# Patient Record
Sex: Female | Born: 1965 | Race: White | Hispanic: No | Marital: Married | Smoking: Former smoker
Health system: Southern US, Community
[De-identification: ages and names within clinical notes are randomized; demographics above are authoritative.]

## PROBLEM LIST (undated history)

## (undated) DIAGNOSIS — E785 Hyperlipidemia, unspecified: Secondary | ICD-10-CM

## (undated) DIAGNOSIS — I10 Essential (primary) hypertension: Secondary | ICD-10-CM

## (undated) DIAGNOSIS — F32A Depression, unspecified: Secondary | ICD-10-CM

## (undated) DIAGNOSIS — G4730 Sleep apnea, unspecified: Secondary | ICD-10-CM

## (undated) DIAGNOSIS — T7840XA Allergy, unspecified, initial encounter: Secondary | ICD-10-CM

## (undated) HISTORY — PX: BREAST EXCISIONAL BIOPSY: SUR124

## (undated) HISTORY — DX: Allergy, unspecified, initial encounter: T78.40XA

## (undated) HISTORY — DX: Hyperlipidemia, unspecified: E78.5

## (undated) HISTORY — PX: BREAST BIOPSY: SHX20

## (undated) HISTORY — DX: Depression, unspecified: F32.A

## (undated) HISTORY — DX: Essential (primary) hypertension: I10

## (undated) HISTORY — DX: Sleep apnea, unspecified: G47.30

---

## 1998-08-30 HISTORY — PX: VAGINAL HYSTERECTOMY: SUR661

## 2013-08-30 HISTORY — PX: BREAST BIOPSY: SHX20

## 2019-06-18 ENCOUNTER — Ambulatory Visit (INDEPENDENT_AMBULATORY_CARE_PROVIDER_SITE_OTHER): Payer: BLUE CROSS/BLUE SHIELD | Admitting: Pulmonary Disease

## 2019-06-18 ENCOUNTER — Other Ambulatory Visit: Payer: Self-pay

## 2019-06-18 VITALS — BP 120/76 | HR 62 | Temp 98.3°F | Ht 67.0 in | Wt 219.8 lb

## 2019-06-18 DIAGNOSIS — G4733 Obstructive sleep apnea (adult) (pediatric): Secondary | ICD-10-CM

## 2019-06-18 NOTE — Addendum Note (Signed)
Addended by: Nena Polio on: 06/18/2019 01:21 PM   Modules accepted: Orders

## 2019-06-18 NOTE — Progress Notes (Signed)
Subjective:     Patient ID: Becky House, female   DOB: 29-Jul-1966, 53 y.o.   MRN: [id number]  Patient with a history of obstructive sleep apnea  Relocated from [place] recently Has been on CPAP therapy  She was diagnosed with moderate obstructive sleep apnea with AHI of 16 Currently on auto titrating CPAP between 8 and 12  She does have some issues with bloating  Recent mask change has been beneficial  Usually goes to bed about 930 to 10 PM, takes about half an hour to 1 hour to fall asleep-she does use melatonin to help fall asleep sometimes  Wakes up about 1-2 times during the night  Final awakening time about 6 AM  She does get sleepy just after lunch  Weight is up about 5 to 10 pounds    Review of Systems  Constitutional: Negative.   HENT: Positive for sinus pressure.   Eyes: Negative.   Respiratory: Positive for apnea.   Cardiovascular: Positive for palpitations.  Gastrointestinal: Negative.   Endocrine: Negative.   Genitourinary: Negative.   Musculoskeletal: Negative.   Skin: Negative.   Allergic/Immunologic: Negative.   Neurological: Negative.   Hematological: Bruises/bleeds easily.  Psychiatric/Behavioral: Positive for decreased concentration and sleep disturbance.    Past medical history significant for depression, hyperlipidemia, allergies Von Willebrand disease and factor V Leiden  Reformed smoker only about 8-pack-year smoking history quit at age 13  Family history of coronary artery disease in mother and father  She has 2 children-no health problems    Objective:   Physical Exam Constitutional:      Appearance: Normal appearance.  HENT:     Head: Normocephalic and atraumatic.     Mouth/Throat:     Mouth: Mucous membranes are moist.  Eyes:     General:        Left eye: No discharge.     Pupils: Pupils are equal, round, and reactive to light.  Neck:     Musculoskeletal: Normal range of motion. No neck rigidity or muscular tenderness.   Cardiovascular:     Rate and Rhythm: Normal rate and regular rhythm.     Pulses: Normal pulses.     Heart sounds: Normal heart sounds. No murmur. No friction rub.  Pulmonary:     Effort: Pulmonary effort is normal. No respiratory distress.     Breath sounds: Normal breath sounds. No stridor. No wheezing or rhonchi.  Abdominal:     General: There is no distension.  Musculoskeletal: Normal range of motion.        General: No swelling.  Skin:    General: Skin is warm.     Coloration: Skin is not jaundiced or pale.  Neurological:     General: No focal deficit present.     Mental Status: She is alert.     Cranial Nerves: No cranial nerve deficit.  Psychiatric:        Mood and Affect: Mood normal.       Compliance data reveals 97% compliance, average hours of sleep 6 hours 42, auto titration 8-12 Residual AHI of 3 .  She does have some issues with bloating  Results of the Epworth flowsheet 06/18/2019  Sitting and reading 2  Watching TV 1  Sitting, inactive in a public place (e.g. a theatre or a meeting) 2  As a passenger in a car for an hour without a break 1  Lying down to rest in the afternoon when circumstances permit 2  Sitting and talking to  someone 0  Sitting quietly after a lunch without alcohol 2  In a car, while stopped for a few minutes in traffic 0  Total score 10    Assessment:     Moderate obstructive sleep apnea .  Compliant with CPAP therapy .  Compliance data does reveal excellent compliance with CPAP with adequate treatment .  Issues with bloating .  Adjust CPAP pressures from 8-12 to 5-15 .  Elevation of the end of the bed may be helpful .  If above is not helpful then we may have to consider BiPAP therapy  Importance of exercise and weight management also discussed .  Recommended graded exercises  Continue CPAP use  Sleep onset insomnia .  She will continue using melatonin .  Dose may be adjusted as needed  .  If she continues having sleep  maintenance issues .  Medications that may help sleep maintenance may also be considered    Plan:   .  I will see her back in the office in about 3 months  .  Encouraged to call with any significant concerns

## 2019-06-18 NOTE — Patient Instructions (Signed)
Obstructive sleep apnea  Good compliance  For bloating -We will adjust pressure to 5-15 from 8-12 -We will try to obtain a download from the machine again in about 3 to 4 weeks -Elevation of the head of her bed may help  If above does not seem to help, we may have to consider switching to BiPAP  Continue with melatonin, may increase dose for effect  We will see you back in the office in 3 months  Call with significant concerns

## 2019-07-31 DIAGNOSIS — R519 Headache, unspecified: Secondary | ICD-10-CM | POA: Diagnosis not present

## 2019-07-31 DIAGNOSIS — Z20828 Contact with and (suspected) exposure to other viral communicable diseases: Secondary | ICD-10-CM | POA: Diagnosis not present

## 2019-07-31 DIAGNOSIS — R5383 Other fatigue: Secondary | ICD-10-CM | POA: Diagnosis not present

## 2019-09-20 ENCOUNTER — Ambulatory Visit: Payer: BLUE CROSS/BLUE SHIELD | Admitting: Pulmonary Disease

## 2019-09-24 ENCOUNTER — Other Ambulatory Visit: Payer: Self-pay

## 2019-09-24 ENCOUNTER — Encounter: Payer: Self-pay | Admitting: Pulmonary Disease

## 2019-09-24 ENCOUNTER — Ambulatory Visit: Payer: BLUE CROSS/BLUE SHIELD | Admitting: Pulmonary Disease

## 2019-09-24 VITALS — BP 124/66 | HR 68 | Temp 97.0°F | Ht 69.0 in | Wt 220.4 lb

## 2019-09-24 DIAGNOSIS — Z9989 Dependence on other enabling machines and devices: Secondary | ICD-10-CM

## 2019-09-24 DIAGNOSIS — G4733 Obstructive sleep apnea (adult) (pediatric): Secondary | ICD-10-CM | POA: Diagnosis not present

## 2019-09-24 NOTE — Progress Notes (Signed)
Subjective:     Patient ID: Becky House, female   DOB: [birth year], 54 y.o.   MRN: [id number]  Patient with a history of obstructive sleep apnea  Continues on CPAP On auto titrating CPAP 8-12  Feels generally better Bloating issues better  She was diagnosed with moderate obstructive sleep apnea with AHI of 16 Currently on auto titrating CPAP between 8 and 12  Recent mask change has been beneficial  Usually goes to bed about 930 to 10 PM, takes about half an hour to 1 hour to fall asleep-she does use melatonin to help fall asleep sometimes  Wakes up about 1-2 times during the night  Final awakening time about 6 AM  She does get sleepy just after lunch  Weight is up about 5 to 10 pounds    Review of Systems  Constitutional: Negative.   HENT: Positive for sinus pressure.   Eyes: Negative.   Respiratory: Positive for apnea.   Cardiovascular: Positive for palpitations.  Gastrointestinal: Negative.   Endocrine: Negative.   Genitourinary: Negative.   Musculoskeletal: Negative.   Skin: Negative.   Allergic/Immunologic: Negative.   Neurological: Negative.   Psychiatric/Behavioral: Positive for decreased concentration and sleep disturbance.    Past medical history significant for depression, hyperlipidemia, allergies Von Willebrand disease and factor V Leiden  Reformed smoker only about 8-pack-year smoking history quit at age 31  Family history of coronary artery disease in mother and father  She has 2 children-no health problems    Objective:   Physical Exam Constitutional:      Appearance: Normal appearance.  HENT:     Head: Normocephalic and atraumatic.  Cardiovascular:     Rate and Rhythm: Normal rate and regular rhythm.     Pulses: Normal pulses.     Heart sounds: Normal heart sounds. No murmur. No friction rub.  Pulmonary:     Effort: Pulmonary effort is normal. No respiratory distress.     Breath sounds: Normal breath sounds. No stridor. No wheezing or  rhonchi.  Musculoskeletal:        General: Normal range of motion.     Cervical back: Normal range of motion. No rigidity. No muscular tenderness.  Neurological:     General: No focal deficit present.     Mental Status: She is alert.  Psychiatric:        Mood and Affect: Mood normal.       Compliance data reveals 97% compliance, average hours of sleep 6 hours 45, auto titration 8-12 Residual AHI of 2  Results of the Epworth flowsheet 06/18/2019  Sitting and reading 2  Watching TV 1  Sitting, inactive in a public place (e.g. a theatre or a meeting) 2  As a passenger in a car for an hour without a break 1  Lying down to rest in the afternoon when circumstances permit 2  Sitting and talking to someone 0  Sitting quietly after a lunch without alcohol 2  In a car, while stopped for a few minutes in traffic 0  Total score 10    Assessment:     Moderate obstructive sleep apnea .  Compliant with CPAP therapy .  Compliance data does reveal excellent compliance with CPAP with adequate treatment-residual AHI of 2 .  Issues with bloating .  CPAP currently set between 8-12  .  Recommended graded exercises  Continue CPAP use  Sleep onset insomnia .  She will continue using melatonin .  Dose may be adjusted as needed-this seems to help  .  If she continues having sleep maintenance issues .  Medications that may help sleep maintenance may also be considered-not desired at present    Plan:   .  I will see her back in the office in about 6 months  .  Encouraged to call with any significant concerns

## 2019-09-24 NOTE — Patient Instructions (Signed)
Obstructive sleep apnea  Your compliance data shows lites adequately treated No changes to CPAP at present  Increase activity as tolerated  We will see you back in 6 months  Call with any concerns

## 2019-12-15 DIAGNOSIS — G4733 Obstructive sleep apnea (adult) (pediatric): Secondary | ICD-10-CM | POA: Diagnosis not present

## 2020-01-14 DIAGNOSIS — G4733 Obstructive sleep apnea (adult) (pediatric): Secondary | ICD-10-CM | POA: Diagnosis not present

## 2020-02-08 DIAGNOSIS — H2513 Age-related nuclear cataract, bilateral: Secondary | ICD-10-CM | POA: Diagnosis not present

## 2020-02-08 DIAGNOSIS — H0015 Chalazion left lower eyelid: Secondary | ICD-10-CM | POA: Diagnosis not present

## 2020-02-11 ENCOUNTER — Other Ambulatory Visit: Payer: Self-pay

## 2020-02-11 ENCOUNTER — Ambulatory Visit: Payer: BC Managed Care – PPO | Admitting: Family

## 2020-02-11 ENCOUNTER — Encounter: Payer: Self-pay | Admitting: Family

## 2020-02-11 VITALS — BP 118/80 | HR 60 | Temp 97.7°F | Resp 16 | Ht 69.0 in | Wt 224.0 lb

## 2020-02-11 DIAGNOSIS — F339 Major depressive disorder, recurrent, unspecified: Secondary | ICD-10-CM | POA: Insufficient documentation

## 2020-02-11 DIAGNOSIS — D689 Coagulation defect, unspecified: Secondary | ICD-10-CM | POA: Diagnosis not present

## 2020-02-11 DIAGNOSIS — G4733 Obstructive sleep apnea (adult) (pediatric): Secondary | ICD-10-CM

## 2020-02-11 DIAGNOSIS — E782 Mixed hyperlipidemia: Secondary | ICD-10-CM | POA: Insufficient documentation

## 2020-02-11 DIAGNOSIS — Z8249 Family history of ischemic heart disease and other diseases of the circulatory system: Secondary | ICD-10-CM

## 2020-02-11 DIAGNOSIS — L989 Disorder of the skin and subcutaneous tissue, unspecified: Secondary | ICD-10-CM

## 2020-02-11 DIAGNOSIS — J301 Allergic rhinitis due to pollen: Secondary | ICD-10-CM

## 2020-02-11 DIAGNOSIS — F32A Depression, unspecified: Secondary | ICD-10-CM | POA: Insufficient documentation

## 2020-02-11 DIAGNOSIS — E785 Hyperlipidemia, unspecified: Secondary | ICD-10-CM | POA: Diagnosis not present

## 2020-02-11 DIAGNOSIS — F329 Major depressive disorder, single episode, unspecified: Secondary | ICD-10-CM

## 2020-02-11 DIAGNOSIS — E1169 Type 2 diabetes mellitus with other specified complication: Secondary | ICD-10-CM

## 2020-02-11 DIAGNOSIS — Z0000 Encounter for general adult medical examination without abnormal findings: Secondary | ICD-10-CM

## 2020-02-11 DIAGNOSIS — J309 Allergic rhinitis, unspecified: Secondary | ICD-10-CM

## 2020-02-11 HISTORY — DX: Allergic rhinitis due to pollen: J30.1

## 2020-02-11 HISTORY — DX: Coagulation defect, unspecified: D68.9

## 2020-02-11 HISTORY — DX: Obstructive sleep apnea (adult) (pediatric): G47.33

## 2020-02-11 HISTORY — DX: Family history of ischemic heart disease and other diseases of the circulatory system: Z82.49

## 2020-02-11 HISTORY — DX: Allergic rhinitis, unspecified: J30.9

## 2020-02-11 HISTORY — DX: Major depressive disorder, recurrent, unspecified: F33.9

## 2020-02-11 HISTORY — DX: Mixed hyperlipidemia: E78.2

## 2020-02-11 LAB — COMPREHENSIVE METABOLIC PANEL
ALT: 19 U/L (ref 0–35)
AST: 16 U/L (ref 0–37)
Albumin: 4.4 g/dL (ref 3.5–5.2)
Alkaline Phosphatase: 83 U/L (ref 39–117)
BUN: 21 mg/dL (ref 6–23)
CO2: 31 mEq/L (ref 19–32)
Calcium: 9.5 mg/dL (ref 8.4–10.5)
Chloride: 103 mEq/L (ref 96–112)
Creatinine, Ser: 0.77 mg/dL (ref 0.40–1.20)
GFR: 77.44 mL/min (ref >60.00)
Glucose, Bld: 90 mg/dL (ref 70–99)
Potassium: 4.4 mEq/L (ref 3.5–5.1)
Sodium: 141 mEq/L (ref 135–145)
Total Bilirubin: 0.4 mg/dL (ref 0.2–1.2)
Total Protein: 6.4 g/dL (ref 6.0–8.3)

## 2020-02-11 LAB — LIPID PANEL
Cholesterol: 223 mg/dL — ABNORMAL HIGH (ref 0–200)
HDL: 57 mg/dL (ref >39.00)
LDL Cholesterol: 129 mg/dL — ABNORMAL HIGH (ref 0–99)
NonHDL: 166.13
Total CHOL/HDL Ratio: 4
Triglycerides: 186 mg/dL — ABNORMAL HIGH (ref 0.0–149.0)
VLDL: 37.2 mg/dL (ref 0.0–40.0)

## 2020-02-11 NOTE — Patient Instructions (Addendum)
Please complete lab work prior to leaving.  Welcome to [place]! 

## 2020-02-11 NOTE — Progress Notes (Signed)
Subjective:    Patient ID: Becky House, female    DOB: September 20, 1965, 54 y.o.   MRN: [id number]  HPI   Patient is a 54 yr old female who presents today to establish care. Moved from [place].  Skin lesions- would like evaluated.  Notes a flaking lesion on her right cheek and some lesions on her abdomen.    Family hx of CAD- both of her parents had early onset CAD. She was followed with a   [facility]- she is maintained on CPAP and followed by sleep medicine (Dr. Ander Slade).  HTN- not on antihypertensive. Was on bp meds in the past.  BP Readings from Last 3 Encounters:  02/11/20 118/80  09/24/19 124/66  06/18/19 120/76   Hyperlipidemia- maintained on atorvastatin. Ran out of lipitor 2 months ago. Taking red yeast rice.   Depression- maintained on zoloft. Not sleeping well. Notes increased stress levels at work. If she wakes up her mind does not shut off.  She has been on zoloft since 1998.  Has to push herself out of bed.    Allergies- on claritin and azelastine.  Reports that this regimen works well for her.    Reports hx of heart palpitations- has seen cardiology. Reports that they did not find any concerning rhythms.    Reports that she is a heterozygote for factor V leiden gene. Reports that when she was 33 she tested positive for von willebrands.     Review of Systems See HPI  Past Medical History:  Diagnosis Date  . Allergy   . Depression   . Hyperlipidemia   . Hypertension   . Sleep apnea      Social History   Socioeconomic History  . Marital status: Married    Spouse name: Not on file  . Number of children: Not on file  . Years of education: Not on file  . Highest education level: Not on file  Occupational History  . Not on file  Tobacco Use  . Smoking status: Former Smoker    Types: Cigarettes  . Smokeless tobacco: Never Used  . Tobacco comment: quit 1988  Substance and Sexual Activity  . Alcohol use: Yes  . Drug use: Never  . Sexual activity: Not on file    Other Topics Concern  . Not on file  Social History Narrative  . Not on file   Social Determinants of Health   Financial Resource Strain:   . Difficulty of Paying Living Expenses:   Food Insecurity:   . Worried About Charity fundraiser in the Last Year:   . Arboriculturist in the Last Year:   Transportation Needs:   . Film/video editor (Medical):   Marland Kitchen Lack of Transportation (Non-Medical):   Physical Activity:   . Days of Exercise per Week:   . Minutes of Exercise per Session:   Stress:   . Feeling of Stress :   Social Connections:   . Frequency of Communication with Friends and Family:   . Frequency of Social Gatherings with Friends and Family:   . Attends Religious Services:   . Active Member of Clubs or Organizations:   . Attends Archivist Meetings:   Marland Kitchen Marital Status:   Intimate Partner Violence:   . Fear of Current or Ex-Partner:   . Emotionally Abused:   Marland Kitchen Physically Abused:   . Sexually Abused:     Past Surgical History:  Procedure Laterality Date  . BREAST BIOPSY  2015  .  VAGINAL HYSTERECTOMY  2000    No family history on file.  No Known Allergies  Current Outpatient Medications on File Prior to Visit  Medication Sig Dispense Refill  . Ascorbic Acid (VITAMIN C) 1000 MG tablet Take 1,000 mg by mouth daily.    Marland Kitchen azelastine (ASTELIN) 0.1 % nasal spray     . Cholecalciferol (VITAMIN D3 PO) Take 1,000 Units by mouth.    . loratadine (CLARITIN) 10 MG tablet Take 10 mg by mouth daily.    . Multiple Vitamins-Minerals (WOMENS MULTI PO) Take by mouth.    . sertraline (ZOLOFT) 25 MG tablet Take 25 mg by mouth daily.    Marland Kitchen atorvastatin (LIPITOR) 10 MG tablet Take 10 mg by mouth daily. (Patient not taking: Reported on 02/11/2020)     No current facility-administered medications on file prior to visit.    BP 118/80 (BP Location: Left Arm, Patient Position: Sitting, Cuff Size: Normal)   Pulse 60   Temp 97.7 F (36.5 C) (Temporal)   Resp 16   Ht 5'  9" (1.753 m)   Wt 224 lb (101.6 kg)   SpO2 99%   BMI 33.08 kg/m       Objective:   Physical Exam Constitutional:      Appearance: She is well-developed.  Neck:     Thyroid: No thyromegaly.  Cardiovascular:     Rate and Rhythm: Normal rate and regular rhythm.     Heart sounds: Normal heart sounds. No murmur heard.   Pulmonary:     Effort: Pulmonary effort is normal. No respiratory distress.     Breath sounds: Normal breath sounds. No wheezing.  Musculoskeletal:     Cervical back: Neck supple.  Skin:    General: Skin is warm and dry.     Comments: Dry flaking lesion noted on right cheek  Dry lesion on left lateral abdomen and similar lesion on right lateral abdomen which appear most consistent with keratoses  Neurological:     Mental Status: She is alert and oriented to person, place, and time.  Psychiatric:        Behavior: Behavior normal.        Thought Content: Thought content normal.        Judgment: Judgment normal.           Assessment & Plan:  (clotting disorder) Heterozygous for factor V Leiden/history of von Willebrand's disease-patient's history is unclear.  She has had a remote work-up.  I will refer to hematology for further evaluation.  Family history of early coronary artery disease-will refer to cardiology for surveillance.  Hyperlipidemia-not currently on statin.  Ultimately hyperlipidemia.  Will likely restart statin.  Skin lesions-will refer to dermatology for consult.  I am concerned about the lesion on her right cheek.  Hypertension-blood pressure currently stable off of medication.  Depression-it seems that she still is having some depressive symptoms.  However she does not wish to increase her medication at this time.  We will continue Zoloft 25 mg once daily.  The patient is advised to let me know if her symptoms worsen or if she changes her mind about medication adjustment.  Allergic rhinitis-currently stable on Astelin and  Claritin.  Obstructive sleep apnea-clinically stable on CPAP.  Management per pulmonology.   This visit occurred during the SARS-CoV-2 public health emergency.  Safety protocols were in place, including screening questions prior to the visit, additional usage of staff PPE, and extensive cleaning of exam room while observing appropriate contact time as  indicated for disinfecting solutions.

## 2020-02-12 ENCOUNTER — Telehealth: Payer: Self-pay | Admitting: Family

## 2020-02-12 NOTE — Telephone Encounter (Signed)
lmom to inform patient of referral appointment 7/18 at 815 am

## 2020-02-14 DIAGNOSIS — G4733 Obstructive sleep apnea (adult) (pediatric): Secondary | ICD-10-CM | POA: Diagnosis not present

## 2020-02-19 ENCOUNTER — Encounter (HOSPITAL_BASED_OUTPATIENT_CLINIC_OR_DEPARTMENT_OTHER): Payer: Self-pay

## 2020-02-19 ENCOUNTER — Ambulatory Visit (HOSPITAL_BASED_OUTPATIENT_CLINIC_OR_DEPARTMENT_OTHER)
Admission: RE | Admit: 2020-02-19 | Discharge: 2020-02-19 | Disposition: A | Discharge status: Home / Self Care | Payer: BC Managed Care – PPO | Source: Ambulatory Visit | Attending: Family | Admitting: Family

## 2020-02-19 ENCOUNTER — Other Ambulatory Visit: Payer: Self-pay

## 2020-02-19 DIAGNOSIS — Z1231 Encounter for screening mammogram for malignant neoplasm of breast: Secondary | ICD-10-CM | POA: Insufficient documentation

## 2020-02-19 DIAGNOSIS — Z0000 Encounter for general adult medical examination without abnormal findings: Secondary | ICD-10-CM

## 2020-02-25 ENCOUNTER — Encounter: Payer: Self-pay | Admitting: Cardiology

## 2020-02-25 ENCOUNTER — Other Ambulatory Visit: Payer: Self-pay

## 2020-02-25 ENCOUNTER — Ambulatory Visit: Payer: BC Managed Care – PPO | Admitting: Cardiology

## 2020-02-25 VITALS — BP 112/90 | HR 63 | Ht 69.0 in | Wt 225.0 lb

## 2020-02-25 DIAGNOSIS — G4733 Obstructive sleep apnea (adult) (pediatric): Secondary | ICD-10-CM | POA: Diagnosis not present

## 2020-02-25 DIAGNOSIS — E782 Mixed hyperlipidemia: Secondary | ICD-10-CM | POA: Diagnosis not present

## 2020-02-25 DIAGNOSIS — R030 Elevated blood-pressure reading, without diagnosis of hypertension: Secondary | ICD-10-CM

## 2020-02-25 DIAGNOSIS — Z8249 Family history of ischemic heart disease and other diseases of the circulatory system: Secondary | ICD-10-CM | POA: Diagnosis not present

## 2020-02-25 DIAGNOSIS — Z79899 Other long term (current) drug therapy: Secondary | ICD-10-CM

## 2020-02-25 DIAGNOSIS — R011 Cardiac murmur, unspecified: Secondary | ICD-10-CM

## 2020-02-25 HISTORY — DX: Cardiac murmur, unspecified: R01.1

## 2020-02-25 HISTORY — DX: Elevated blood-pressure reading, without diagnosis of hypertension: R03.0

## 2020-02-25 NOTE — Patient Instructions (Signed)
Medication Instructions:  No medication changes. *If you need a refill on your cardiac medications before your next appointment, please call your pharmacy*   Lab Work: Your physician recommends that you return for lab work in: before your next visit. You need to have labs done when you are fasting.  You can come Monday through Friday 8:30 am to 12:00 pm and 1:15 to 4:30. You do not need to make an appointment as the order has already been placed. The labs you are going to have done are LFT and Lipids.   If you have labs (blood work) drawn today and your tests are completely normal, you will receive your results only by: Marland Kitchen MyChart Message (if you have MyChart) OR . A paper copy in the mail If you have any lab test that is abnormal or we need to change your treatment, we will call you to review the results.   Testing/Procedures: Your physician has requested that you have an echocardiogram. Echocardiography is a painless test that uses sound waves to create images of your heart. It provides your doctor with information about the size and shape of your heart and how well your heart's chambers and valves are working. This procedure takes approximately one hour. There are no restrictions for this procedure.  We will order CT coronary calcium score $150  Please call [phone] to schedule   [facility]  [address], [place] [address]  Follow-Up: At [facility], you and your health needs are our priority.  As part of our continuing mission to provide you with exceptional heart care, we have created designated Provider Care Teams.  These Care Teams include your primary Cardiologist (physician) and Advanced Practice Providers (APPs -  Physician Assistants and Nurse Practitioners) who all work together to provide you with the care you need, when you need it.  We recommend signing up for the patient portal called "MyChart".  Sign up information is provided on this After  Visit Summary.  MyChart is used to connect with patients for Virtual Visits (Telemedicine).  Patients are able to view lab/test results, encounter notes, upcoming appointments, etc.  Non-urgent messages can be sent to your provider as well.   To learn more about what you can do with MyChart, go to [email or web address].    Your next appointment:   3 month(s)  The format for your next appointment:   In Person  Provider:   Jyl Heinz, MD   Other Instructions  Coronary Calcium Scan A coronary calcium scan is an imaging test used to look for deposits of plaque in the inner lining of the blood vessels of the heart (coronary arteries). Plaque is made up of calcium, protein, and fatty substances. These deposits of plaque can partly clog and narrow the coronary arteries without producing any symptoms or warning signs. This puts a person at risk for a heart attack. This test is recommended for people who are at moderate risk for heart disease. The test can find plaque deposits before symptoms develop. Tell a health care provider about:  Any allergies you have.  All medicines you are taking, including vitamins, herbs, eye drops, creams, and over-the-counter medicines.  Any problems you or family members have had with anesthetic medicines.  Any blood disorders you have.  Any surgeries you have had.  Any medical conditions you have.  Whether you are pregnant or may be pregnant. What are the risks? Generally, this is a safe procedure. However, problems may  occur, including:  Harm to a pregnant woman and her unborn baby. This test involves the use of radiation. Radiation exposure can be dangerous to a pregnant woman and her unborn baby. If you are pregnant or think you may be pregnant, you should not have this procedure done.  Slight increase in the risk of cancer. This is because of the radiation involved in the test. What happens before the procedure? Ask your health care provider  for any specific instructions on how to prepare for this procedure. You may be asked to avoid products that contain caffeine, tobacco, or nicotine for 4 hours before the procedure. What happens during the procedure?   You will undress and remove any jewelry from your neck or chest.  You will put on a hospital gown.  Sticky electrodes will be placed on your chest. The electrodes will be connected to an electrocardiogram (ECG) machine to record a tracing of the electrical activity of your heart.  You will lie down on a curved bed that is attached to the [place].  You may be given medicine to slow down your heart rate so that clear pictures can be created.  You will be moved into the CT scanner, and the CT scanner will take pictures of your heart. During this time, you will be asked to lie still and hold your breath for 2-3 seconds at a time while each picture of your heart is being taken. The procedure may vary among health care providers and hospitals. What happens after the procedure?  You can get dressed.  You can return to your normal activities.  It is up to you to get the results of your procedure. Ask your health care provider, or the department that is doing the procedure, when your results will be ready. Summary  A coronary calcium scan is an imaging test used to look for deposits of plaque in the inner lining of the blood vessels of the heart (coronary arteries). Plaque is made up of calcium, protein, and fatty substances.  Generally, this is a safe procedure. Tell your health care provider if you are pregnant or may be pregnant.  Ask your health care provider for any specific instructions on how to prepare for this procedure.  A CT scanner will take pictures of your heart.  You can return to your normal activities after the scan is done. This information is not intended to replace advice given to you by your health care provider. Make sure you discuss any questions you  have with your health care provider. Document Revised: 03/06/2019 Document Reviewed: 03/06/2019 Elsevier Patient Education  [place].  Echocardiogram An echocardiogram is a procedure that uses painless sound waves (ultrasound) to produce an image of the heart. Images from an echocardiogram can provide important information about:  Signs of coronary artery disease (CAD).  Aneurysm detection. An aneurysm is a weak or damaged part of an artery wall that bulges out from the normal force of blood pumping through the body.  Heart size and shape. Changes in the size or shape of the heart can be associated with certain conditions, including heart failure, aneurysm, and CAD.  Heart muscle function.  Heart valve function.  Signs of a past heart attack.  Fluid buildup around the heart.  Thickening of the heart muscle.  A tumor or infectious growth around the heart valves. Tell a health care provider about:  Any allergies you have.  All medicines you are taking, including vitamins, herbs, eye drops,  creams, and over-the-counter medicines.  Any blood disorders you have.  Any surgeries you have had.  Any medical conditions you have.  Whether you are pregnant or may be pregnant. What are the risks? Generally, this is a safe procedure. However, problems may occur, including:  Allergic reaction to dye (contrast) that may be used during the procedure. What happens before the procedure? No specific preparation is needed. You may eat and drink normally. What happens during the procedure?   An IV tube may be inserted into one of your veins.  You may receive contrast through this tube. A contrast is an injection that improves the quality of the pictures from your heart.  A gel will be applied to your chest.  A wand-like tool (transducer) will be moved over your chest. The gel will help to transmit the sound waves from the transducer.  The sound waves will harmlessly bounce  off of your heart to allow the heart images to be captured in real-time motion. The images will be recorded on a computer. The procedure may vary among health care providers and hospitals. What happens after the procedure?  You may return to your normal, everyday life, including diet, activities, and medicines, unless your health care provider tells you not to do that. Summary  An echocardiogram is a procedure that uses painless sound waves (ultrasound) to produce an image of the heart.  Images from an echocardiogram can provide important information about the size and shape of your heart, heart muscle function, heart valve function, and fluid buildup around your heart.  You do not need to do anything to prepare before this procedure. You may eat and drink normally.  After the echocardiogram is completed, you may return to your normal, everyday life, unless your health care provider tells you not to do that. This information is not intended to replace advice given to you by your health care provider. Make sure you discuss any questions you have with your health care provider. Document Revised: 12/07/2018 Document Reviewed: 09/18/2016 Elsevier Patient Education  [place].

## 2020-02-25 NOTE — Progress Notes (Signed)
Cardiology Office Note:    Date:  02/25/2020   ID:  Becky House, DOB 07-Dec-1965, MRN [id number]  PCP:  Becky Alar, NP  Cardiologist:  Becky Lindau, MD   Referring MD: Becky Alar, NP    ASSESSMENT:    1. OSA (obstructive sleep apnea)   2. Cardiac murmur   3. Mixed dyslipidemia   4. Family history of early CAD   76. Blood pressure elevated without history of HTN    PLAN:    In order of problems listed above:  1. Primary prevention stressed with the patient.  Importance of compliance with diet medication stressed and she vocalized understanding.  Weight reduction was stressed.  I told her to exercise at least half an hour a day 5 days a week and she promises to do so. 2. Mixed dyslipidemia: Lipids were reviewed extensively.  Low-carb diet and diet low in fatty foods was emphasized and she promises to do better.  We will recheck her blood work in 3 months when she comes for follow-up appointment. 3. Cardiac murmur: Echocardiogram will be done to assess murmur heard on auscultation 4. Strong family history of coronary artery disease: We will monitor lipids and also get records CT score of coronary arteries to assess risks.  This was explained to her.  Weight reduction was stressed and she promises to do better. 5. Obstructive sleep apnea: Sleep health issues were discussed 6. Patient will be seen in follow-up appointment in 3 months or earlier if the patient has any concerns    Medication Adjustments/Labs and Tests Ordered: Current medicines are reviewed at length with the patient today.  Concerns regarding medicines are outlined above.  No orders of the defined types were placed in this encounter.  No orders of the defined types were placed in this encounter.    History of Present Illness:    Becky House is a 54 y.o. female who is being seen today for the evaluation of family history of coronary artery disease and mixed dyslipidemia at the request of  Becky Alar, NP.  Patient is a pleasant 54 year old female.  She has past medical history of mixed dyslipidemia.  She leads a sedentary lifestyle.  She denies any chest pain orthopnea or PND.  She did bicycling for about 16 miles on the weekend without any problems.  No chest pain orthopnea or PND.  She has strong family history of coronary artery disease.  At the time of my evaluation, the patient is alert awake oriented and in no distress.  Past Medical History:  Diagnosis Date  . Allergy   . Depression   . Hyperlipidemia   . Hypertension   . Sleep apnea     Past Surgical History:  Procedure Laterality Date  . BREAST BIOPSY  2015  . BREAST EXCISIONAL BIOPSY    . VAGINAL HYSTERECTOMY  2000    Current Medications: Current Meds  Medication Sig  . Ascorbic Acid (VITAMIN C) 1000 MG tablet Take 1,000 mg by mouth daily.  Marland Kitchen azelastine (ASTELIN) 0.1 % nasal spray   . Cholecalciferol (VITAMIN D3 PO) Take 1,000 Units by mouth.  . loratadine (CLARITIN) 10 MG tablet Take 10 mg by mouth daily.  . Multiple Vitamins-Minerals (WOMENS MULTI PO) Take by mouth.  . Red Yeast Rice 500 MG/0.5GM POWD   . sertraline (ZOLOFT) 25 MG tablet Take 25 mg by mouth daily.     Allergies:   Patient has no known allergies.   Social History   Socioeconomic History  .  Marital status: Married    Spouse name: Not on file  . Number of children: Not on file  . Years of education: Not on file  . Highest education level: Not on file  Occupational History  . Not on file  Tobacco Use  . Smoking status: Former Smoker    Types: Cigarettes  . Smokeless tobacco: Never Used  . Tobacco comment: quit 1988  Substance and Sexual Activity  . Alcohol use: Yes    Comment: on weekends/socially  . Drug use: Never  . Sexual activity: Not on file  Other Topics Concern  . Not on file  Social History Narrative   Works for [employer], in Chief Executive Officer   2 grown Daughters- in [place]   Has 2 grandchildren   Enjoys  e-biking.     Walking   Has a dog   Completed some college    Social Determinants of Health   Financial Resource Strain:   . Difficulty of Paying Living Expenses:   Food Insecurity:   . Worried About Charity fundraiser in the Last Year:   . Arboriculturist in the Last Year:   Transportation Needs:   . Film/video editor (Medical):   Marland Kitchen Lack of Transportation (Non-Medical):   Physical Activity:   . Days of Exercise per Week:   . Minutes of Exercise per Session:   Stress:   . Feeling of Stress :   Social Connections:   . Frequency of Communication with Friends and Family:   . Frequency of Social Gatherings with Friends and Family:   . Attends Religious Services:   . Active Member of Clubs or Organizations:   . Attends Archivist Meetings:   Marland Kitchen Marital Status:      Family History: The patient's family history includes CAD (age of onset: 62) in her mother; CAD (age of onset: 65) in her father.  ROS:   Please see the history of present illness.    All other systems reviewed and are negative.  EKGs/Labs/Other Studies Reviewed:    The following studies were reviewed today: EKG reveals sinus rhythm and nonspecific ST-T changes   Recent Labs: 02/11/2020: ALT 19; BUN 21; Creatinine, Ser 0.77; Potassium 4.4; Sodium 141  Recent Lipid Panel    Component Value Date/Time   CHOL 223 (H) 02/11/2020 0854   TRIG 186.0 (H) 02/11/2020 0854   HDL 57.00 02/11/2020 0854   CHOLHDL 4 02/11/2020 0854   VLDL 37.2 02/11/2020 0854   LDLCALC 129 (H) 02/11/2020 0854    Physical Exam:    VS:  BP 112/90   Pulse 63   Ht 5\' 9"  (1.753 m)   Wt 225 lb (102.1 kg)   SpO2 96%   BMI 33.23 kg/m     Wt Readings from Last 3 Encounters:  02/25/20 225 lb (102.1 kg)  02/11/20 224 lb (101.6 kg)  09/24/19 220 lb 6.4 oz (100 kg)     GEN: Patient is in no acute distress HEENT: Normal NECK: No JVD; No carotid bruits LYMPHATICS: No lymphadenopathy CARDIAC: S1 S2 regular, 2/6 systolic  murmur at the apex. RESPIRATORY:  Clear to auscultation without rales, wheezing or rhonchi  ABDOMEN: Soft, non-tender, non-distended MUSCULOSKELETAL:  No edema; No deformity  SKIN: Warm and dry NEUROLOGIC:  Alert and oriented x 3 PSYCHIATRIC:  Normal affect    Signed, Becky Lindau, MD  02/25/2020 8:38 AM    [place]

## 2020-02-26 MED ORDER — ROSUVASTATIN CALCIUM 10 MG PO TABS
10.0000 mg | ORAL_TABLET | Freq: Every day | ORAL | 3 refills | Status: DC
Start: 2020-02-26 — End: 2020-12-15

## 2020-03-03 ENCOUNTER — Encounter: Payer: Self-pay | Admitting: Family

## 2020-03-04 MED ORDER — SERTRALINE HCL 25 MG PO TABS: 25.0000 mg | ORAL_TABLET | Freq: Every day | ORAL | 1 refills | Status: DC

## 2020-03-05 ENCOUNTER — Ambulatory Visit (HOSPITAL_BASED_OUTPATIENT_CLINIC_OR_DEPARTMENT_OTHER): Payer: BC Managed Care – PPO

## 2020-03-07 DIAGNOSIS — H0015 Chalazion left lower eyelid: Secondary | ICD-10-CM | POA: Diagnosis not present

## 2020-03-14 ENCOUNTER — Other Ambulatory Visit: Payer: Self-pay

## 2020-03-14 ENCOUNTER — Encounter: Payer: Self-pay | Admitting: Physician Assistant

## 2020-03-14 ENCOUNTER — Ambulatory Visit: Payer: BC Managed Care – PPO | Admitting: Physician Assistant

## 2020-03-14 ENCOUNTER — Other Ambulatory Visit: Payer: Self-pay | Admitting: Family

## 2020-03-14 DIAGNOSIS — D1800 Hemangioma unspecified site: Secondary | ICD-10-CM

## 2020-03-14 DIAGNOSIS — D229 Melanocytic nevi, unspecified: Secondary | ICD-10-CM

## 2020-03-14 DIAGNOSIS — D485 Neoplasm of uncertain behavior of skin: Secondary | ICD-10-CM

## 2020-03-14 DIAGNOSIS — D2262 Melanocytic nevi of left upper limb, including shoulder: Secondary | ICD-10-CM

## 2020-03-14 DIAGNOSIS — D0439 Carcinoma in situ of skin of other parts of face: Secondary | ICD-10-CM | POA: Diagnosis not present

## 2020-03-14 DIAGNOSIS — L738 Other specified follicular disorders: Secondary | ICD-10-CM

## 2020-03-14 DIAGNOSIS — B360 Pityriasis versicolor: Secondary | ICD-10-CM | POA: Diagnosis not present

## 2020-03-14 DIAGNOSIS — L578 Other skin changes due to chronic exposure to nonionizing radiation: Secondary | ICD-10-CM

## 2020-03-14 DIAGNOSIS — Z1283 Encounter for screening for malignant neoplasm of skin: Secondary | ICD-10-CM

## 2020-03-14 DIAGNOSIS — L814 Other melanin hyperpigmentation: Secondary | ICD-10-CM | POA: Diagnosis not present

## 2020-03-14 DIAGNOSIS — D6800 Von Willebrand disease, unspecified: Secondary | ICD-10-CM

## 2020-03-14 DIAGNOSIS — D689 Coagulation defect, unspecified: Secondary | ICD-10-CM

## 2020-03-14 DIAGNOSIS — L821 Other seborrheic keratosis: Secondary | ICD-10-CM

## 2020-03-14 DIAGNOSIS — D0430 Carcinoma in situ of skin of unspecified part of face: Secondary | ICD-10-CM

## 2020-03-14 NOTE — Progress Notes (Addendum)
New Patient   Subjective  Becky House is a 54 y.o. female who presents for the following: Skin Problem (right cheek x 1 year- stays scaly, spots on abdomen - ?sk).  The following portions of the chart were reviewed this encounter and updated as appropriate: Tobacco  Allergies  Meds  Problems  Med Hx  Surg Hx  Fam Hx      Objective  Well appearing patient in no apparent distress; mood and affect are within normal limits.  A full examination was performed including scalp, head, eyes, ears, nose, lips, neck, chest, axillae, abdomen, back, buttocks, bilateral upper extremities, bilateral lower extremities, hands, feet, fingers, toes, fingernails, and toenails. All findings within normal limits unless otherwise noted below.  Objective  Head - Anterior (Face): Small yellow papules with a central dell.   Objective  Abdomen: Red papules.  Objective  Left Nasal Sidewall: Bichromic dark nested macule. .      Objective  head to toe: No atypical nevi   Objective  Right Buccal Cheek : Hyperkeratotic scale with pink base      Assessment & Plan  Sebaceous hyperplasia Head - Anterior (Face)  Hemangioma, unspecified site Abdomen  Nevus spilus Left Forearm - Posterior  Neoplasm of uncertain behavior of skin Left Nasal Sidewall  Epidermal / dermal shaving  Lesion diameter (cm):  1 Informed consent: discussed and consent obtained   Timeout: patient name, date of birth, surgical site, and procedure verified   Procedure prep:  Patient was prepped and draped in usual sterile fashion Prep type:  Chlorhexidine Anesthesia: the lesion was anesthetized in a standard fashion   Anesthetic:  1% lidocaine w/ epinephrine 1-100,000 local infiltration Instrument used: DermaBlade   Hemostasis achieved with: aluminum chloride   Outcome: patient tolerated procedure well   Post-procedure details: sterile dressing applied and wound care instructions given   Dressing type:  petrolatum gauze, petrolatum and bandage    Specimen 2 - Surgical pathology Differential Diagnosis: Lentigo R/O dysplasia Check Margins: No  Screening exam for skin cancer head to toe  Carcinoma in situ of skin of face, unspecified location Right Buccal Cheek   Epidermal / dermal shaving  Lesion diameter (cm):  1.2 Informed consent: discussed and consent obtained   Timeout: patient name, date of birth, surgical site, and procedure verified   Procedure prep:  Patient was prepped and draped in usual sterile fashion Prep type:  Chlorhexidine Anesthesia: the lesion was anesthetized in a standard fashion   Anesthetic:  1% lidocaine w/ epinephrine 1-100,000 local infiltration Instrument used: DermaBlade and flexible razor blade   Hemostasis achieved with: pressure, aluminum chloride and electrodesiccation   Outcome: patient tolerated procedure well   Post-procedure details: sterile dressing applied and wound care instructions given   Post-procedure details comment:  Ointment and small bandage applied Dressing type: petrolatum gauze, petrolatum and bandage    Specimen 1 - Surgical pathology Differential Diagnosis: R/O SCC Check Margins: No Lentigines - Scattered tan macules - Discussed due to sun exposure - Benign, observe - Call for any changes  Seborrheic Keratoses - Stuck-on, waxy, tan-brown papules and plaques  - Discussed benign etiology and prognosis. - Observe - Call for any changes  Melanocytic Nevi - Tan-brown and/or pink-flesh-colored symmetric macules and papules - Benign appearing on exam today - Observation - Call clinic for new or changing moles - Recommend daily use of broad spectrum spf 30+ sunscreen to sun-exposed areas.   Hemangiomas - Red papules - Discussed benign nature - Observe -  Call for any changes  Actinic Damage - diffuse scaly erythematous macules with underlying dyspigmentation - Recommend daily broad spectrum sunscreen SPF 30+ to  sun-exposed areas, reapply every 2 hours as needed.  - Call for new or changing lesions.  Skin cancer screening performed today.

## 2020-03-15 DIAGNOSIS — G4733 Obstructive sleep apnea (adult) (pediatric): Secondary | ICD-10-CM | POA: Diagnosis not present

## 2020-03-17 ENCOUNTER — Telehealth: Payer: Self-pay | Admitting: Family

## 2020-03-17 ENCOUNTER — Inpatient Hospital Stay: Payer: BC Managed Care – PPO | Attending: Family | Admitting: Family

## 2020-03-17 ENCOUNTER — Ambulatory Visit (INDEPENDENT_AMBULATORY_CARE_PROVIDER_SITE_OTHER): Payer: BC Managed Care – PPO | Admitting: Family

## 2020-03-17 ENCOUNTER — Other Ambulatory Visit: Payer: Self-pay

## 2020-03-17 ENCOUNTER — Encounter: Payer: Self-pay | Admitting: Family

## 2020-03-17 ENCOUNTER — Inpatient Hospital Stay: Payer: BC Managed Care – PPO

## 2020-03-17 VITALS — BP 126/80 | HR 57 | Temp 96.8°F | Resp 16 | Ht 69.0 in | Wt 225.0 lb

## 2020-03-17 VITALS — BP 129/74 | HR 65 | Temp 96.8°F | Resp 16 | Ht 69.0 in | Wt 225.1 lb

## 2020-03-17 DIAGNOSIS — D689 Coagulation defect, unspecified: Secondary | ICD-10-CM

## 2020-03-17 DIAGNOSIS — Z0000 Encounter for general adult medical examination without abnormal findings: Secondary | ICD-10-CM

## 2020-03-17 DIAGNOSIS — Z23 Encounter for immunization: Secondary | ICD-10-CM

## 2020-03-17 DIAGNOSIS — F17210 Nicotine dependence, cigarettes, uncomplicated: Secondary | ICD-10-CM | POA: Insufficient documentation

## 2020-03-17 DIAGNOSIS — D6800 Von Willebrand disease, unspecified: Secondary | ICD-10-CM

## 2020-03-17 DIAGNOSIS — R002 Palpitations: Secondary | ICD-10-CM | POA: Insufficient documentation

## 2020-03-17 DIAGNOSIS — I10 Essential (primary) hypertension: Secondary | ICD-10-CM | POA: Diagnosis not present

## 2020-03-17 DIAGNOSIS — G4730 Sleep apnea, unspecified: Secondary | ICD-10-CM | POA: Diagnosis not present

## 2020-03-17 DIAGNOSIS — D6851 Activated protein C resistance: Secondary | ICD-10-CM | POA: Diagnosis not present

## 2020-03-17 DIAGNOSIS — E785 Hyperlipidemia, unspecified: Secondary | ICD-10-CM | POA: Diagnosis not present

## 2020-03-17 DIAGNOSIS — Z79899 Other long term (current) drug therapy: Secondary | ICD-10-CM | POA: Diagnosis not present

## 2020-03-17 DIAGNOSIS — F329 Major depressive disorder, single episode, unspecified: Secondary | ICD-10-CM | POA: Diagnosis not present

## 2020-03-17 DIAGNOSIS — Z90721 Acquired absence of ovaries, unilateral: Secondary | ICD-10-CM | POA: Insufficient documentation

## 2020-03-17 LAB — CBC WITH DIFFERENTIAL (CANCER CENTER ONLY)
Abs Immature Granulocytes: 0.02 10*3/uL (ref 0.00–0.07)
Basophils Absolute: 0 10*3/uL (ref 0.0–0.1)
Basophils Relative: 1 %
Eosinophils Absolute: 0.1 10*3/uL (ref 0.0–0.5)
Eosinophils Relative: 2 %
HCT: 41.2 % (ref 36.0–46.0)
Hemoglobin: 13.4 g/dL (ref 12.0–15.0)
Immature Granulocytes: 0 %
Lymphocytes Relative: 33 %
Lymphs Abs: 1.6 10*3/uL (ref 0.7–4.0)
MCH: 30.4 pg (ref 26.0–34.0)
MCHC: 32.5 g/dL (ref 30.0–36.0)
MCV: 93.4 fL (ref 80.0–100.0)
Monocytes Absolute: 0.3 10*3/uL (ref 0.1–1.0)
Monocytes Relative: 7 %
Neutro Abs: 2.7 10*3/uL (ref 1.7–7.7)
Neutrophils Relative %: 57 %
Platelet Count: 218 10*3/uL (ref 150–400)
RBC: 4.41 MIL/uL (ref 3.87–5.11)
RDW: 13.2 % (ref 11.5–15.5)
WBC Count: 4.7 10*3/uL (ref 4.0–10.5)
nRBC: 0 % (ref 0.0–0.2)

## 2020-03-17 LAB — LACTATE DEHYDROGENASE: LDH: 156 U/L (ref 98–192)

## 2020-03-17 LAB — CMP (CANCER CENTER ONLY)
ALT: 18 U/L (ref 0–44)
AST: 15 U/L (ref 15–41)
Albumin: 4.6 g/dL (ref 3.5–5.0)
Alkaline Phosphatase: 75 U/L (ref 38–126)
Anion gap: 6 (ref 5–15)
BUN: 16 mg/dL (ref 6–20)
CO2: 31 mmol/L (ref 22–32)
Calcium: 9.8 mg/dL (ref 8.9–10.3)
Chloride: 104 mmol/L (ref 98–111)
Creatinine: 0.89 mg/dL (ref 0.44–1.00)
GFR, Est AFR Am: >60 mL/min (ref >60)
GFR, Estimated: >60 mL/min (ref >60)
Glucose, Bld: 104 mg/dL — ABNORMAL HIGH (ref 70–99)
Potassium: 3.6 mmol/L (ref 3.5–5.1)
Sodium: 141 mmol/L (ref 135–145)
Total Bilirubin: 0.5 mg/dL (ref 0.3–1.2)
Total Protein: 6.8 g/dL (ref 6.5–8.1)

## 2020-03-17 LAB — ANTITHROMBIN III: AntiThromb III Func: 99 % (ref 75–120)

## 2020-03-17 NOTE — Progress Notes (Signed)
Subjective:    Patient ID: Becky House, female    DOB: 04/10/1966, 54 y.o.   MRN: [id number]  HPI  Patient presents today for complete physical.  Immunizations: reports last tetanus was 2019, would like shingrix Diet: trying to limit breads/pastas Wt Readings from Last 3 Encounters:  03/17/20 225 lb (102.1 kg)  02/25/20 225 lb (102.1 kg)  02/11/20 224 lb (101.6 kg)  Exercise: walking 2 miles a day Colonoscopy: 2015- normal per patient, was told 10 yr follow up.   Pap Smear: N/A hysterectomy Mammogram: 02/19/20 Dental/vision: up to date     Review of Systems  Constitutional: Negative for unexpected weight change.  HENT: Negative for hearing loss and rhinorrhea.   Eyes: Negative for visual disturbance.  Respiratory: Negative for cough.   Cardiovascular: Negative for chest pain.  Gastrointestinal: Negative for constipation and diarrhea.  Genitourinary: Negative for dysuria and frequency.  Musculoskeletal: Negative for arthralgias and myalgias.  Skin: Negative for rash.  Neurological: Negative for headaches.  Hematological: Negative for adenopathy.  Psychiatric/Behavioral:       Denies depression/anxiety       Past Medical History:  Diagnosis Date  . Allergy   . Depression   . Hyperlipidemia   . Hypertension   . Sleep apnea      Social History   Socioeconomic History  . Marital status: Married    Spouse name: Not on file  . Number of children: Not on file  . Years of education: Not on file  . Highest education level: Not on file  Occupational History  . Not on file  Tobacco Use  . Smoking status: Former Smoker    Types: Cigarettes  . Smokeless tobacco: Never Used  . Tobacco comment: quit 1988  Substance and Sexual Activity  . Alcohol use: Yes    Comment: on weekends/socially  . Drug use: Never  . Sexual activity: Not on file  Other Topics Concern  . Not on file  Social History Narrative   Works for [employer], in Chief Executive Officer   2 grown Daughters- in  [place]   Has 2 grandchildren   Enjoys e-biking.     Walking   Has a dog   Completed some college    Social Determinants of Health   Financial Resource Strain:   . Difficulty of Paying Living Expenses:   Food Insecurity:   . Worried About Charity fundraiser in the Last Year:   . Arboriculturist in the Last Year:   Transportation Needs:   . Film/video editor (Medical):   Marland Kitchen Lack of Transportation (Non-Medical):   Physical Activity:   . Days of Exercise per Week:   . Minutes of Exercise per Session:   Stress:   . Feeling of Stress :   Social Connections:   . Frequency of Communication with Friends and Family:   . Frequency of Social Gatherings with Friends and Family:   . Attends Religious Services:   . Active Member of Clubs or Organizations:   . Attends Archivist Meetings:   Marland Kitchen Marital Status:   Intimate Partner Violence:   . Fear of Current or Ex-Partner:   . Emotionally Abused:   Marland Kitchen Physically Abused:   . Sexually Abused:     Past Surgical History:  Procedure Laterality Date  . BREAST BIOPSY  2015  . BREAST EXCISIONAL BIOPSY    . VAGINAL HYSTERECTOMY  2000    Family History  Problem Relation Age of Onset  .  CAD Mother 54       died of MI  . CAD Father 4       died at 67 (had cabg)    No Known Allergies  Current Outpatient Medications on File Prior to Visit  Medication Sig Dispense Refill  . Ascorbic Acid (VITAMIN C) 1000 MG tablet Take 1,000 mg by mouth daily.    Marland Kitchen azelastine (ASTELIN) 0.1 % nasal spray     . Cholecalciferol (VITAMIN D3 PO) Take 1,000 Units by mouth.    . loratadine (CLARITIN) 10 MG tablet Take 10 mg by mouth daily.    . Multiple Vitamins-Minerals (WOMENS MULTI PO) Take by mouth.    . rosuvastatin (CRESTOR) 10 MG tablet Take 1 tablet (10 mg total) by mouth daily. 90 tablet 3  . sertraline (ZOLOFT) 25 MG tablet Take 1 tablet (25 mg total) by mouth daily. 90 tablet 1   No current facility-administered medications  on file prior to visit.    BP 126/80 (BP Location: Right Arm, Patient Position: Sitting, Cuff Size: Normal)   Pulse (!) 57   Temp (!) 96.8 F (36 C) (Temporal)   Resp 16   Ht 5\' 9"  (1.753 m)   Wt 225 lb (102.1 kg)   SpO2 98%   BMI 33.23 kg/m    Objective:   Physical Exam Physical Exam  Constitutional: She is oriented to person, place, and time. She appears well-developed and well-nourished. No distress.  HENT:  Head: Normocephalic and atraumatic.  Right Ear: Tympanic membrane and ear canal normal.  Left Ear: Tympanic membrane and ear canal normal.  Mouth/Throat: Not examined- pt wearing mask Eyes: Pupils are equal, round, and reactive to light. No scleral icterus.  Neck: Normal range of motion. No thyromegaly present.  Cardiovascular: Normal rate and regular rhythm.   No murmur heard. Pulmonary/Chest: Effort normal and breath sounds normal. No respiratory distress. He has no wheezes. She has no rales. She exhibits no tenderness.  Abdominal: Soft. Bowel sounds are normal. She exhibits no distension and no mass. There is no tenderness. There is no rebound and no guarding.  Musculoskeletal: She exhibits no edema.  Lymphadenopathy:    She has no cervical adenopathy.  Neurological: She is alert and oriented to person, place, and time. She has normal patellar reflexes. She exhibits normal muscle tone. Coordination normal.  Skin: Skin is warm and dry. s/p skin biopsy right cheek- biopsy site is clean/dry/intact Psychiatric: She has a normal mood and affect. Her behavior is normal. Judgment and thought content normal.  Breast/pelvis: deferred      Assessment & Plan:  Preventative care- discussed healthy diet, exercise, weight loss.  Mammo/colo up to date.  Lab work up to date.  Shingrix #1 today.   This visit occurred during the SARS-CoV-2 public health emergency.  Safety protocols were in place, including screening questions prior to the visit, additional usage of staff PPE, and  extensive cleaning of exam room while observing appropriate contact time as indicated for disinfecting solutions.         Assessment & Plan:

## 2020-03-17 NOTE — Telephone Encounter (Signed)
TBD, waiting on lab work per 7/19 los

## 2020-03-17 NOTE — Addendum Note (Signed)
Addended by: Wynonia Musty A on: 03/17/2020 08:23 AM   Modules accepted: Orders

## 2020-03-17 NOTE — Progress Notes (Signed)
Hematology/Oncology Consultation   Name: Becky House      MRN: [id number]    Location: Room/bed info not found  Date: 03/17/2020 Time:8:52 AM   REFERRING PHYSICIAN: Debbrah Alar, NP  REASON FOR CONSULT: Clotting disorder   DIAGNOSIS: Per patient - Von Willebrand's disease and Heterozygous for Factor V mutation    HISTORY OF PRESENT ILLNESS: Becky House is a very pleasant 54 yo caucasian female with history of both Von Willebrand's disease as well as Factor V Leiden, heterozygous.   She states that she had very heavy cycles when she was younger and while living in [place] was found to have VWD in 2000. She was treated with Stimate P prior to having a partial hysterectomy at age 34 (still has 1 ovary). She did well tolerating both the medication and surgery.  She has 2 daughters and no history of miscarriage. She states that they have both been tested and are negative.  She states that she bruises and bleeds easily. She is able to stop her bleeding at home without intervention within 10-15 minutes.  She states that she had 2 skin spots removed recently by dermatology and they had to cauterize both sites as they kept oozing. Since then she has healed nicely. She states that after a family member was diagnosed with Factor V Leiden she was tested and was found to be heterozygous.  She has no personal history of thrombus. Her last surgery was on her shoulder and she states that she was treated with Stimate P prior to and anticoagulation after.  She has never required a blood transfusion. She has sleep apnea and wears her CPAP regularly.  Both of her parents passed away you from heart attacks.  She has occasional palpitations but states her work up with cardiology over the years has been negative.  No fever, chills, n/v, cough, rash, dizziness, SOB, chest pain, abdominal pain or changes in bowel or bladder habits.  No swelling, tenderness, numbness or tingling in her extremities at this  time.  She preventatively wears compression stockings when traveling.  No falls or syncopal episodes to report.  She has maintained a good appetite and is staying well hydrated. Her weight is stable.  She has lived in [place] for the last year or so.   ROS: All other 10 point review of systems is negative.   PAST MEDICAL HISTORY:   Past Medical History:  Diagnosis Date  . Allergy   . Depression   . Hyperlipidemia   . Hypertension   . Sleep apnea     ALLERGIES: No Known Allergies    MEDICATIONS:  Current Outpatient Medications on File Prior to Visit  Medication Sig Dispense Refill  . Ascorbic Acid (VITAMIN C) 1000 MG tablet Take 1,000 mg by mouth daily.    Marland Kitchen azelastine (ASTELIN) 0.1 % nasal spray     . Cholecalciferol (VITAMIN D3 PO) Take 1,000 Units by mouth.    . loratadine (CLARITIN) 10 MG tablet Take 10 mg by mouth daily.    . Multiple Vitamins-Minerals (WOMENS MULTI PO) Take by mouth.    . rosuvastatin (CRESTOR) 10 MG tablet Take 1 tablet (10 mg total) by mouth daily. 90 tablet 3  . sertraline (ZOLOFT) 25 MG tablet Take 1 tablet (25 mg total) by mouth daily. 90 tablet 1   No current facility-administered medications on file prior to visit.     PAST SURGICAL HISTORY Past Surgical History:  Procedure Laterality Date  . BREAST BIOPSY  2015  .  BREAST EXCISIONAL BIOPSY    . VAGINAL HYSTERECTOMY  2000    FAMILY HISTORY: Family History  Problem Relation Age of Onset  . CAD Mother 68       died of MI  . CAD Father 68       died at 33 (had cabg)    SOCIAL HISTORY:  reports that she has quit smoking. Her smoking use included cigarettes. She has never used smokeless tobacco. She reports current alcohol use. She reports that she does not use drugs.  PERFORMANCE STATUS: The patient's performance status is 0 - Asymptomatic  PHYSICAL EXAM: Most Recent Vital Signs: Blood pressure 129/74, pulse 65, temperature (!) 96.8 F (36 C), temperature source Oral, resp. rate 16,  height 5\' 9"  (1.753 m), weight 225 lb 1.9 oz (102.1 kg), SpO2 100 %. BP 129/74 (BP Location: Left Arm, Patient Position: Sitting)   Pulse 65   Temp (!) 96.8 F (36 C) (Oral)   Resp 16   Ht 5\' 9"  (1.753 m)   Wt 225 lb 1.9 oz (102.1 kg)   SpO2 100%   BMI 33.24 kg/m   General Appearance:    Alert, cooperative, no distress, appears stated age  Head:    Normocephalic, without obvious abnormality, atraumatic  Eyes:    PERRL, conjunctiva/corneas clear, EOM's intact, fundi    benign, both eyes        Throat:   Lips, mucosa, and tongue normal; teeth and gums normal  Neck:   Supple, symmetrical, trachea midline, no adenopathy;    thyroid:  no enlargement/tenderness/nodules; no carotid   bruit or JVD  Back:     Symmetric, no curvature, ROM normal, no CVA tenderness  Lungs:     Clear to auscultation bilaterally, respirations unlabored  Chest Wall:    No tenderness or deformity   Heart:    Regular rate and rhythm, S1 and S2 normal, no murmur, rub   or gallop     Abdomen:     Soft, non-tender, bowel sounds active all four quadrants,    no masses, no organomegaly        Extremities:   Extremities normal, atraumatic, no cyanosis or edema  Pulses:   2+ and symmetric all extremities  Skin:   Skin color, texture, turgor normal, no rashes or lesions  Lymph nodes:   Cervical, supraclavicular, and axillary nodes normal  Neurologic:   CNII-XII intact, normal strength, sensation and reflexes    throughout    LABORATORY DATA:  Results for orders placed or performed in visit on 03/17/20 (from the past 48 hour(s))  CBC with Differential (Cancer Center Only)     Status: None   Collection Time: 03/17/20  8:15 AM  Result Value Ref Range   WBC Count 4.7 4.0 - 10.5 K/uL   RBC 4.41 3.87 - 5.11 MIL/uL   Hemoglobin 13.4 12.0 - 15.0 g/dL   HCT 41.2 36 - 46 %   MCV 93.4 80.0 - 100.0 fL   MCH 30.4 26.0 - 34.0 pg   MCHC 32.5 30.0 - 36.0 g/dL   RDW 13.2 11.5 - 15.5 %   Platelet Count 218 150 - 400 K/uL    nRBC 0.0 0.0 - 0.2 %   Neutrophils Relative % 57 %   Neutro Abs 2.7 1.7 - 7.7 K/uL   Lymphocytes Relative 33 %   Lymphs Abs 1.6 0.7 - 4.0 K/uL   Monocytes Relative 7 %   Monocytes Absolute 0.3 0 - 1 K/uL  Eosinophils Relative 2 %   Eosinophils Absolute 0.1 0 - 0 K/uL   Basophils Relative 1 %   Basophils Absolute 0.0 0 - 0 K/uL   Immature Granulocytes 0 %   Abs Immature Granulocytes 0.02 0.00 - 0.07 K/uL    Comment: Performed at [facility] at [facility], [address] [place], [place] [address]      RADIOGRAPHY: No results found.     PATHOLOGY: None    ASSESSMENT/PLAN: Becky House is a very pleasant 54 yo caucasian female with history of both Von Willebrand's disease as well as Factor V Leiden, heterozygous.   We did extensive lab work up today including VWD panel and hypercoag work up.  We will see what her results show and determine a plan of care and follow-up.  We will continue to follow-up with and treat prior to and after surgery when needed.    All questions were answered and she is in agreement with the plan. She can contact our office with any questions or concerns. We can certainly see her sooner if needed.    Laverna Peace, NP

## 2020-03-17 NOTE — Patient Instructions (Addendum)
Please continue your work with healthy diet, exercise and weight loss.   Preventive Care 81-56 Years Old, Female Preventive care refers to visits with your health care provider and lifestyle choices that can promote health and wellness. This includes:  A yearly physical exam. This may also be called an annual well check.  Regular dental visits and eye exams.  Immunizations.  Screening for certain conditions.  Healthy lifestyle choices, such as eating a healthy diet, getting regular exercise, not using drugs or products that contain nicotine and tobacco, and limiting alcohol use. What can I expect for my preventive care visit? Physical exam Your health care provider will check your:  Height and weight. This may be used to calculate body mass index (BMI), which tells if you are at a healthy weight.  Heart rate and blood pressure.  Skin for abnormal spots. Counseling Your health care provider may ask you questions about your:  Alcohol, tobacco, and drug use.  Emotional well-being.  Home and relationship well-being.  Sexual activity.  Eating habits.  Work and work Statistician.  Method of birth control.  Menstrual cycle.  Pregnancy history. What immunizations do I need?  Influenza (flu) vaccine  This is recommended every year. Tetanus, diphtheria, and pertussis (Tdap) vaccine  You may need a Td booster every 10 years. Varicella (chickenpox) vaccine  You may need this if you have not been vaccinated. Zoster (shingles) vaccine  You may need this after age 66. Measles, mumps, and rubella (MMR) vaccine  You may need at least one dose of MMR if you were born in 1957 or later. You may also need a second dose. Pneumococcal conjugate (PCV13) vaccine  You may need this if you have certain conditions and were not previously vaccinated. Pneumococcal polysaccharide (PPSV23) vaccine  You may need one or two doses if you smoke cigarettes or if you have certain  conditions. Meningococcal conjugate (MenACWY) vaccine  You may need this if you have certain conditions. Hepatitis A vaccine  You may need this if you have certain conditions or if you travel or work in places where you may be exposed to hepatitis A. Hepatitis B vaccine  You may need this if you have certain conditions or if you travel or work in places where you may be exposed to hepatitis B. Haemophilus influenzae type b (Hib) vaccine  You may need this if you have certain conditions. Human papillomavirus (HPV) vaccine  If recommended by your health care provider, you may need three doses over 6 months. You may receive vaccines as individual doses or as more than one vaccine together in one shot (combination vaccines). Talk with your health care provider about the risks and benefits of combination vaccines. What tests do I need? Blood tests  Lipid and cholesterol levels. These may be checked every 5 years, or more frequently if you are over 53 years old.  Hepatitis C test.  Hepatitis B test. Screening  Lung cancer screening. You may have this screening every year starting at age 50 if you have a 30-pack-year history of smoking and currently smoke or have quit within the past 15 years.  Colorectal cancer screening. All adults should have this screening starting at age 44 and continuing until age 52. Your health care provider may recommend screening at age 85 if you are at increased risk. You will have tests every 1-10 years, depending on your results and the type of screening test.  Diabetes screening. This is done by checking your blood sugar (glucose)  after you have not eaten for a while (fasting). You may have this done every 1-3 years.  Mammogram. This may be done every 1-2 years. Talk with your health care provider about when you should start having regular mammograms. This may depend on whether you have a family history of breast cancer.  BRCA-related cancer screening. This  may be done if you have a family history of breast, ovarian, tubal, or peritoneal cancers.  Pelvic exam and Pap test. This may be done every 3 years starting at age 26. Starting at age 30, this may be done every 5 years if you have a Pap test in combination with an HPV test. Other tests  Sexually transmitted disease (STD) testing.  Bone density scan. This is done to screen for osteoporosis. You may have this scan if you are at high risk for osteoporosis. Follow these instructions at home: Eating and drinking  Eat a diet that includes fresh fruits and vegetables, whole grains, lean protein, and low-fat dairy.  Take vitamin and mineral supplements as recommended by your health care provider.  Do not drink alcohol if: ? Your health care provider tells you not to drink. ? You are pregnant, may be pregnant, or are planning to become pregnant.  If you drink alcohol: ? Limit how much you have to 0-1 drink a day. ? Be aware of how much alcohol is in your drink. In the [place] one drink equals one 12 oz bottle of beer (355 mL), one 5 oz glass of wine (148 mL), or one 1 oz glass of hard liquor (44 mL). Lifestyle  Take daily care of your teeth and gums.  Stay active. Exercise for at least 30 minutes on 5 or more days each week.  Do not use any products that contain nicotine or tobacco, such as cigarettes, e-cigarettes, and chewing tobacco. If you need help quitting, ask your health care provider.  If you are sexually active, practice safe sex. Use a condom or other form of birth control (contraception) in order to prevent pregnancy and STIs (sexually transmitted infections).  If told by your health care provider, take low-dose aspirin daily starting at age 35. What's next?  Visit your health care provider once a year for a well check visit.  Ask your health care provider how often you should have your eyes and teeth checked.  Stay up to date on all vaccines. This information is not  intended to replace advice given to you by your health care provider. Make sure you discuss any questions you have with your health care provider. Document Revised: 04/27/2018 Document Reviewed: 04/27/2018 Elsevier Patient Education  2020 [employer].

## 2020-03-18 LAB — HOMOCYSTEINE: Homocysteine: 8.6 umol/L (ref 0.0–14.5)

## 2020-03-19 LAB — LUPUS ANTICOAGULANT PANEL
DRVVT: 28.1 s (ref 0.0–47.0)
PTT Lupus Anticoagulant: 33.5 s (ref 0.0–51.9)

## 2020-03-19 LAB — CARDIOLIPIN ANTIBODIES, IGG, IGM, IGA
Anticardiolipin IgA: <9 APL U/mL (ref 0–11)
Anticardiolipin IgG: <9 GPL U/mL (ref 0–14)
Anticardiolipin IgM: <9 MPL U/mL (ref 0–12)

## 2020-03-19 LAB — PROTEIN C ACTIVITY: Protein C Activity: 121 % (ref 73–180)

## 2020-03-19 LAB — PROTEIN S ACTIVITY: Protein S Activity: 83 % (ref 63–140)

## 2020-03-19 LAB — PROTEIN S, TOTAL: Protein S Ag, Total: 88 % (ref 60–150)

## 2020-03-20 LAB — VON WILLEBRAND PANEL
Coagulation Factor VIII: 87 % (ref 56–140)
Ristocetin Co-factor, Plasma: 63 % (ref 50–200)
Von Willebrand Antigen, Plasma: 69 % (ref 50–200)

## 2020-03-20 LAB — BETA-2-GLYCOPROTEIN I ABS, IGG/M/A
Beta-2 Glyco I IgG: <9 GPI IgG units (ref 0–20)
Beta-2-Glycoprotein I IgA: <9 GPI IgA units (ref 0–25)
Beta-2-Glycoprotein I IgM: <9 GPI IgM units (ref 0–32)

## 2020-03-20 LAB — FACTOR 5 LEIDEN

## 2020-03-20 LAB — COAG STUDIES INTERP REPORT

## 2020-03-20 LAB — PROTHROMBIN GENE MUTATION

## 2020-03-21 LAB — PROTEIN C, TOTAL: Protein C, Total: 116 % (ref 60–150)

## 2020-03-26 ENCOUNTER — Telehealth: Payer: Self-pay | Admitting: Family

## 2020-03-26 ENCOUNTER — Other Ambulatory Visit: Payer: Self-pay | Admitting: Family

## 2020-03-26 DIAGNOSIS — D689 Coagulation defect, unspecified: Secondary | ICD-10-CM

## 2020-03-26 DIAGNOSIS — D6800 Von Willebrand disease, unspecified: Secondary | ICD-10-CM

## 2020-03-26 NOTE — Telephone Encounter (Signed)
I was able to go over Becky House's lab work with her in detail. 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 She is doing well and has no questions or concerns at this time.  We will plan to see her again in another 8 weeks and check a PT/INR and PTT at that time.

## 2020-03-27 ENCOUNTER — Telehealth: Payer: Self-pay | Admitting: Family

## 2020-03-27 NOTE — Telephone Encounter (Signed)
Appointments scheduled calendar printed & mailed per 7/28 sch msg

## 2020-03-31 ENCOUNTER — Other Ambulatory Visit (HOSPITAL_BASED_OUTPATIENT_CLINIC_OR_DEPARTMENT_OTHER): Payer: BC Managed Care – PPO

## 2020-04-02 ENCOUNTER — Telehealth: Payer: Self-pay

## 2020-04-02 NOTE — Telephone Encounter (Signed)
Pathology to patient and surgery made with Dr Denna Haggard dur to [place] out till [facility].

## 2020-04-02 NOTE — Telephone Encounter (Signed)
-----   Message from Warren Danes, [place] sent at 03/20/2020 11:14 AM EDT ----- 30 min surgery

## 2020-04-15 DIAGNOSIS — G4733 Obstructive sleep apnea (adult) (pediatric): Secondary | ICD-10-CM | POA: Diagnosis not present

## 2020-04-21 DIAGNOSIS — E782 Mixed hyperlipidemia: Secondary | ICD-10-CM | POA: Diagnosis not present

## 2020-04-21 DIAGNOSIS — Z79899 Other long term (current) drug therapy: Secondary | ICD-10-CM | POA: Diagnosis not present

## 2020-04-22 LAB — HEPATIC FUNCTION PANEL
ALT: 25 IU/L (ref 0–32)
AST: 20 IU/L (ref 0–40)
Albumin: 4.7 g/dL (ref 3.8–4.9)
Alkaline Phosphatase: 95 IU/L (ref 48–121)
Bilirubin Total: 0.4 mg/dL (ref 0.0–1.2)
Bilirubin, Direct: 0.11 mg/dL (ref 0.00–0.40)
Total Protein: 6.7 g/dL (ref 6.0–8.5)

## 2020-04-22 LAB — LIPID PANEL
Chol/HDL Ratio: 3.4 ratio (ref 0.0–4.4)
Cholesterol, Total: 196 mg/dL (ref 100–199)
HDL: 57 mg/dL (ref >39)
LDL Chol Calc (NIH): 113 mg/dL — ABNORMAL HIGH (ref 0–99)
Triglycerides: 146 mg/dL (ref 0–149)
VLDL Cholesterol Cal: 26 mg/dL (ref 5–40)

## 2020-04-23 ENCOUNTER — Telehealth: Payer: Self-pay | Admitting: Cardiology

## 2020-04-23 NOTE — Telephone Encounter (Signed)
Becky House is returning Ai call.

## 2020-04-23 NOTE — Telephone Encounter (Signed)
Results reviewed with pt as per Dr. Revankar's note.  Pt verbalized understanding and had no additional questions. Routed to PCP.  

## 2020-04-25 ENCOUNTER — Encounter: Payer: Self-pay | Admitting: Pulmonary Disease

## 2020-04-25 ENCOUNTER — Ambulatory Visit: Payer: BC Managed Care – PPO | Admitting: Pulmonary Disease

## 2020-04-25 ENCOUNTER — Other Ambulatory Visit: Payer: Self-pay

## 2020-04-25 VITALS — BP 118/76 | HR 68 | Temp 98.2°F | Ht 69.0 in | Wt 227.8 lb

## 2020-04-25 DIAGNOSIS — G4733 Obstructive sleep apnea (adult) (pediatric): Secondary | ICD-10-CM | POA: Diagnosis not present

## 2020-04-25 DIAGNOSIS — Z9989 Dependence on other enabling machines and devices: Secondary | ICD-10-CM | POA: Diagnosis not present

## 2020-04-25 MED ORDER — ESZOPICLONE 2 MG PO TABS: 2.0000 mg | ORAL_TABLET | Freq: Every evening | ORAL | 3 refills | Status: DC | PRN

## 2020-04-25 NOTE — Patient Instructions (Addendum)
Obstructive sleep apnea -Continue compliance with CPAP -Contact [employer] regarding mask upgrade/optimization  Lunesta appears covered for you I did send in a prescription for 2 mg to be used nightly You may come down to 1 mg if you feel this is too strong for you  Call with any significant concerns  Follow-up in 6 months

## 2020-04-25 NOTE — Progress Notes (Signed)
Subjective:     Patient ID: Becky House, female   DOB: 08/22/1966, 54 y.o.   MRN: [id number]  Patient with a history of obstructive sleep apnea  Continues on CPAP On auto titrating CPAP 8-12  Feels generally better  She was diagnosed with moderate obstructive sleep apnea with AHI of 16 Currently on auto titrating CPAP between 8 and 12  Recent mask change has been beneficial still occasionally having mask issues Encouraged to contact medical supply company for mask change-[facility]  Usually goes to bed about 930 to 10 PM, takes about half an hour to 1 hour to fall asleep-she does use melatonin to help fall asleep sometimes  Wakes up about 1-2 times during the night Awakening has been marked problematic recently  Was taking melatonin but this does not seem to be helping much  Final awakening time about 6 AM  She does get sleepy just after lunch      Review of Systems  Constitutional: Negative.   HENT: Positive for sinus pressure.   Eyes: Negative.   Respiratory: Positive for apnea.   Gastrointestinal: Negative.   Endocrine: Negative.   Genitourinary: Negative.   Musculoskeletal: Negative.   Skin: Negative.   Allergic/Immunologic: Negative.   Neurological: Negative.   Psychiatric/Behavioral: Positive for decreased concentration and sleep disturbance.    Past medical history significant for depression, hyperlipidemia, allergies Von Willebrand disease and factor V Leiden  Reformed smoker only about 8-pack-year smoking history quit at age 18  Family history of coronary artery disease in mother and father  She has 2 children-no health problems    Objective:   Physical Exam Constitutional:      Appearance: Normal appearance.  HENT:     Head: Normocephalic and atraumatic.  Cardiovascular:     Rate and Rhythm: Normal rate and regular rhythm.     Pulses: Normal pulses.     Heart sounds: Normal heart sounds. No murmur heard.  No friction rub.  Pulmonary:      Effort: Pulmonary effort is normal. No respiratory distress.     Breath sounds: Normal breath sounds. No stridor. No wheezing or rhonchi.  Musculoskeletal:     Cervical back: Normal range of motion. No rigidity. No muscular tenderness.  Neurological:     Mental Status: She is alert.       Compliance data reveals 97% compliance Set between 8 and 12 Maximum leak of about 57.5 AHI 1.7  Results of the Epworth flowsheet 06/18/2019  Sitting and reading 2  Watching TV 1  Sitting, inactive in a public place (e.g. a theatre or a meeting) 2  As a passenger in a car for an hour without a break 1  Lying down to rest in the afternoon when circumstances permit 2  Sitting and talking to someone 0  Sitting quietly after a lunch without alcohol 2  In a car, while stopped for a few minutes in traffic 0  Total score 10    Assessment:     Moderate obstructive sleep apnea .  Compliant with CPAP therapy  .  Excellent compliance  Issues with multiple awakenings -Tiredness in the morning following multiple awakenings -Melatonin not helping -will prescribe Lunesta  Issues with bloating .  CPAP currently set between 8-12 -Continue current CPAP  Plan:   .  I will see her back in the office in about 6 months  .  Encouraged to call with any significant concerns  .  Lunesta 2 mg nightly  .  Contact medical  supply company regarding mask change

## 2020-04-30 DIAGNOSIS — C4492 Squamous cell carcinoma of skin, unspecified: Secondary | ICD-10-CM | POA: Insufficient documentation

## 2020-04-30 HISTORY — DX: Squamous cell carcinoma of skin, unspecified: C44.92

## 2020-05-01 ENCOUNTER — Ambulatory Visit (INDEPENDENT_AMBULATORY_CARE_PROVIDER_SITE_OTHER)
Admission: RE | Admit: 2020-05-01 | Discharge: 2020-05-01 | Disposition: A | Discharge status: Home / Self Care | Payer: Self-pay | Source: Ambulatory Visit | Attending: Cardiology | Admitting: Cardiology

## 2020-05-01 ENCOUNTER — Other Ambulatory Visit: Payer: Self-pay

## 2020-05-01 DIAGNOSIS — Z8249 Family history of ischemic heart disease and other diseases of the circulatory system: Secondary | ICD-10-CM

## 2020-05-02 ENCOUNTER — Telehealth: Payer: Self-pay

## 2020-05-02 NOTE — Telephone Encounter (Signed)
Spoke with patient regarding results and recommendation.  Patient verbalizes understanding and is agreeable to plan of care. Advised patient to call back with any issues or concerns.  

## 2020-05-02 NOTE — Telephone Encounter (Signed)
-----   Message from Jenean Lindau, MD sent at 05/02/2020  1:20 PM EDT ----- The results of the study is unremarkable. Please inform patient. I will discuss in detail at next appointment. Cc  primary care/referring physician Jenean Lindau, MD 05/02/2020 1:20 PM

## 2020-05-16 DIAGNOSIS — G4733 Obstructive sleep apnea (adult) (pediatric): Secondary | ICD-10-CM | POA: Diagnosis not present

## 2020-05-20 ENCOUNTER — Ambulatory Visit (INDEPENDENT_AMBULATORY_CARE_PROVIDER_SITE_OTHER): Payer: BC Managed Care – PPO | Admitting: *Deleted

## 2020-05-20 ENCOUNTER — Other Ambulatory Visit: Payer: Self-pay

## 2020-05-20 DIAGNOSIS — Z23 Encounter for immunization: Secondary | ICD-10-CM | POA: Diagnosis not present

## 2020-05-20 NOTE — Progress Notes (Signed)
Patient here for second shingles vaccine.  Vaccine given in left deltoid and patient tolerated well.  

## 2020-05-21 ENCOUNTER — Telehealth: Payer: Self-pay | Admitting: Hematology & Oncology

## 2020-05-21 NOTE — Telephone Encounter (Signed)
Returned call to patient she was requesting to cancel her 9/27 appt.  Appointments were cancelled.  I asked her to call back if she wished to reschedule

## 2020-05-26 ENCOUNTER — Inpatient Hospital Stay: Payer: BC Managed Care – PPO | Admitting: Family

## 2020-05-26 ENCOUNTER — Inpatient Hospital Stay: Payer: BC Managed Care – PPO

## 2020-05-29 ENCOUNTER — Ambulatory Visit (INDEPENDENT_AMBULATORY_CARE_PROVIDER_SITE_OTHER): Payer: BC Managed Care – PPO | Admitting: Dermatology

## 2020-05-29 ENCOUNTER — Encounter: Payer: Self-pay | Admitting: Dermatology

## 2020-05-29 ENCOUNTER — Other Ambulatory Visit: Payer: Self-pay

## 2020-05-29 DIAGNOSIS — D099 Carcinoma in situ, unspecified: Secondary | ICD-10-CM

## 2020-05-29 DIAGNOSIS — D0439 Carcinoma in situ of skin of other parts of face: Secondary | ICD-10-CM

## 2020-05-29 NOTE — Progress Notes (Signed)
Right buccal cheek - curet 5FU 1.4 cm size Tx

## 2020-05-30 ENCOUNTER — Encounter: Payer: Self-pay | Admitting: Cardiology

## 2020-05-30 ENCOUNTER — Ambulatory Visit (INDEPENDENT_AMBULATORY_CARE_PROVIDER_SITE_OTHER): Payer: BC Managed Care – PPO | Admitting: Cardiology

## 2020-05-30 VITALS — BP 124/84 | HR 66 | Ht 69.0 in | Wt 227.0 lb

## 2020-05-30 DIAGNOSIS — Z8249 Family history of ischemic heart disease and other diseases of the circulatory system: Secondary | ICD-10-CM | POA: Diagnosis not present

## 2020-05-30 DIAGNOSIS — E782 Mixed hyperlipidemia: Secondary | ICD-10-CM | POA: Diagnosis not present

## 2020-05-30 DIAGNOSIS — G4733 Obstructive sleep apnea (adult) (pediatric): Secondary | ICD-10-CM | POA: Diagnosis not present

## 2020-05-30 HISTORY — PX: SKIN SURGERY: SHX2413

## 2020-05-30 NOTE — Patient Instructions (Signed)

## 2020-05-30 NOTE — Progress Notes (Signed)
Cardiology Office Note:    Date:  05/30/2020   ID:  Becky House, DOB [birth year], MRN [id number]  PCP:  Debbrah Alar, NP  Cardiologist:  Jenean Lindau, MD   Referring MD: Debbrah Alar, NP    ASSESSMENT:    1. Mixed hyperlipidemia   2. Family history of early CAD   3. Mixed dyslipidemia   4. OSA (obstructive sleep apnea)    PLAN:    In order of problems listed above:  1. I discussed my findings with the patient in extensive length and primary prevention stressed.  Importance of compliance with diet medication stressed and she vocalized understanding.  Patient was advised to walk half an hour a day 5 days a week and she promises to do so. 2. Essential hypertension: Blood pressure stable and diet was emphasized 3. Mixed dyslipidemia and overweight: Diet was emphasized.  Weight reduction was stressed.  She promises to do better.  Lipids were reviewed with her 4. Sleep apnea: Sleep health issues were discussed. 5. Patient will be seen in follow-up appointment in 6 months or earlier if the patient has any concerns    Medication Adjustments/Labs and Tests Ordered: Current medicines are reviewed at length with the patient today.  Concerns regarding medicines are outlined above.  No orders of the defined types were placed in this encounter.  No orders of the defined types were placed in this encounter.    Chief Complaint  Patient presents with  . Follow-up     History of Present Illness:    Becky House is a 54 y.o. female.  Patient has past medical history of essential hypertension, sleep apnea and family history of coronary artery disease.  She has history of mixed dyslipidemia.  She denies any problems at this time and takes care of activities of daily living.  No chest pain orthopnea or PND.  She leads a sedentary lifestyle.  At the time of my evaluation, the patient is alert awake oriented and in no distress.  Past Medical History:  Diagnosis Date  .  Allergy   . Depression   . Hyperlipidemia   . Hypertension   . Sleep apnea     Past Surgical History:  Procedure Laterality Date  . BREAST BIOPSY  2015  . BREAST EXCISIONAL BIOPSY    . SKIN SURGERY  05/30/2020   Excision of carcinoma Right cheek  . VAGINAL HYSTERECTOMY  2000    Current Medications: Current Meds  Medication Sig  . Ascorbic Acid (VITAMIN C) 1000 MG tablet Take 1,000 mg by mouth daily.  Marland Kitchen azelastine (ASTELIN) 0.1 % nasal spray   . Cholecalciferol (VITAMIN D3 PO) Take 1,000 Units by mouth.  . eszopiclone (LUNESTA) 2 MG TABS tablet Take 1 tablet (2 mg total) by mouth at bedtime as needed for sleep. Take immediately before bedtime  . loratadine (CLARITIN) 10 MG tablet Take 10 mg by mouth daily.  . Multiple Vitamins-Minerals (WOMENS MULTI PO) Take by mouth.  . rosuvastatin (CRESTOR) 10 MG tablet Take 1 tablet (10 mg total) by mouth daily.  . sertraline (ZOLOFT) 25 MG tablet Take 1 tablet (25 mg total) by mouth daily.     Allergies:   Patient has no known allergies.   Social History   Socioeconomic History  . Marital status: Married    Spouse name: Not on file  . Number of children: Not on file  . Years of education: Not on file  . Highest education level: Not on file  Occupational History  .  Not on file  Tobacco Use  . Smoking status: Former Smoker    Packs/day: 1.00    Years: 7.00    Pack years: 7.00    Types: Cigarettes    Quit date: 08/30/1985    Years since quitting: 34.7  . Smokeless tobacco: Never Used  . Tobacco comment: quit 1988  Vaping Use  . Vaping Use: Never used  Substance and Sexual Activity  . Alcohol use: Yes    Comment: on weekends/socially  . Drug use: Never  . Sexual activity: Not on file  Other Topics Concern  . Not on file  Social History Narrative   Works for [employer], in Chief Executive Officer   2 grown Daughters- in [place]   Has 2 grandchildren   Enjoys e-biking.     Walking   Has a dog   Completed some college    Social  Determinants of Health   Financial Resource Strain:   . Difficulty of Paying Living Expenses: Not on file  Food Insecurity:   . Worried About Charity fundraiser in the Last Year: Not on file  . Ran Out of Food in the Last Year: Not on file  Transportation Needs:   . Lack of Transportation (Medical): Not on file  . Lack of Transportation (Non-Medical): Not on file  Physical Activity:   . Days of Exercise per Week: Not on file  . Minutes of Exercise per Session: Not on file  Stress:   . Feeling of Stress : Not on file  Social Connections:   . Frequency of Communication with Friends and Family: Not on file  . Frequency of Social Gatherings with Friends and Family: Not on file  . Attends Religious Services: Not on file  . Active Member of Clubs or Organizations: Not on file  . Attends Archivist Meetings: Not on file  . Marital Status: Not on file     Family History: The patient's family history includes CAD (age of onset: 64) in her mother; CAD (age of onset: 48) in her father.  ROS:   Please see the history of present illness.    All other systems reviewed and are negative.  EKGs/Labs/Other Studies Reviewed:    The following studies were reviewed today: 05/01/2020 22:16  CLINICAL DATA:  Risk stratification  EXAM: Coronary Calcium Score  TECHNIQUE: The patient was scanned on a [employer]. Axial non-contrast 3 mm slices were carried out through the heart. The data set was analyzed on a dedicated work station and scored using the [place].  FINDINGS: Non-cardiac: See separate report from [facility].  Ascending Aorta: Normal  Pericardium: Normal  Coronary arteries: Normal origin.  IMPRESSION: Coronary calcium score of 0. This was 0 percentile for age and sex matched control.  Kardie Tobb,DO   Electronically Signed   By: Berniece Salines DO   On: 05/01/2020 22:16   Recent Labs: 03/17/2020: BUN 16; Creatinine  0.89; Hemoglobin 13.4; Platelet Count 218; Potassium 3.6; Sodium 141 04/21/2020: ALT 25  Recent Lipid Panel    Component Value Date/Time   CHOL 196 04/21/2020 0825   TRIG 146 04/21/2020 0825   HDL 57 04/21/2020 0825   CHOLHDL 3.4 04/21/2020 0825   CHOLHDL 4 02/11/2020 0854   VLDL 37.2 02/11/2020 0854   LDLCALC 113 (H) 04/21/2020 0825    Physical Exam:    VS:  BP 124/84 (BP Location: Right Arm, Patient Position: Sitting, Cuff Size: Large)   Pulse 66   Ht  5\' 9"  (1.753 m)   Wt 227 lb (103 kg)   SpO2 97%   BMI 33.52 kg/m     Wt Readings from Last 3 Encounters:  05/30/20 227 lb (103 kg)  04/25/20 227 lb 12.8 oz (103.3 kg)  03/17/20 225 lb 1.9 oz (102.1 kg)     GEN: Patient is in no acute distress HEENT: Normal NECK: No JVD; No carotid bruits LYMPHATICS: No lymphadenopathy CARDIAC: Hear sounds regular, 2/6 systolic murmur at the apex. RESPIRATORY:  Clear to auscultation without rales, wheezing or rhonchi  ABDOMEN: Soft, non-tender, non-distended MUSCULOSKELETAL:  No edema; No deformity  SKIN: Warm and dry NEUROLOGIC:  Alert and oriented x 3 PSYCHIATRIC:  Normal affect   Signed, Jenean Lindau, MD  05/30/2020 8:34 AM    [place]

## 2020-07-03 NOTE — Progress Notes (Signed)
   Follow-Up Visit   Subjective  Becky House is a 54 y.o. female who presents for the following: Procedure (right cheek scc in situ).  Procudure Location: right buccal cheek Duration:  Quality:  Associated Signs/Symptoms: Modifying Factors:  Severity:  Timing: Context:   Objective  Well appearing patient in no apparent distress; mood and affect are within normal limits.  Treatment done on Right buccal cheek carcinoma in situ.    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   Destruction of lesion Complexity: simple   Destruction method: electrodesiccation and curettage   Informed consent: discussed and consent obtained   Timeout:  patient name, date of birth, surgical site, and procedure verified Anesthesia: the lesion was anesthetized in a standard fashion   Anesthetic:  1% lidocaine w/ epinephrine 1-100,000 local infiltration Curettage performed in three different directions: Yes   Curettage cycles:  3 Lesion length (cm):  1 Lesion width (cm):  1.4 Margin per side (cm):  0 Final wound size (cm):  1.4 Hemostasis achieved with:  ferric subsulfate Outcome: patient tolerated procedure well with no complications   Post-procedure details: sterile dressing applied and wound care instructions given   Dressing type: bandage and petrolatum   Additional details:  Wound inoculated with 5% Fluorouracil.    Follow up in 3 months.      I, Lavonna Monarch, MD, have reviewed all documentation for this visit.  The documentation on 07/05/20 for the exam, diagnosis, procedures, and orders are all accurate and complete.

## 2020-07-05 ENCOUNTER — Encounter: Payer: Self-pay | Admitting: Dermatology

## 2020-07-08 ENCOUNTER — Other Ambulatory Visit: Payer: Self-pay

## 2020-07-08 ENCOUNTER — Ambulatory Visit: Payer: BC Managed Care – PPO | Admitting: Family

## 2020-07-08 ENCOUNTER — Encounter: Payer: Self-pay | Admitting: Family

## 2020-07-08 VITALS — BP 135/76 | HR 56 | Temp 98.1°F | Resp 16 | Ht 69.0 in | Wt 226.0 lb

## 2020-07-08 DIAGNOSIS — J309 Allergic rhinitis, unspecified: Secondary | ICD-10-CM

## 2020-07-08 DIAGNOSIS — F419 Anxiety disorder, unspecified: Secondary | ICD-10-CM | POA: Diagnosis not present

## 2020-07-08 DIAGNOSIS — G4700 Insomnia, unspecified: Secondary | ICD-10-CM

## 2020-07-08 MED ORDER — SERTRALINE HCL 50 MG PO TABS
50.0000 mg | ORAL_TABLET | Freq: Every day | ORAL | 3 refills | Status: DC
Start: 2020-07-08 — End: 2020-08-05

## 2020-07-08 NOTE — Progress Notes (Signed)
Subjective:    Patient ID: Becky House, female    DOB: 12-Jul-1966, 54 y.o.   MRN: [id number]  HPI  Patient is a 54 yr old female who presents today with chief complaint of insomnia.  She is taking lunesta nightly which is helping.  Feels lke she can't shut her brain off.  States she has never been as stressed at work as she is now.  Working a lot of hours.  She has been hiking every Sunday.   She reports that she falls asleep but then wakes up.  When she wakes up, she says her mind starts racing and worrying about things.   Reports + nasal congestion. Using tylenol cold.  Had run out of her claritin. Reports + cheek pressure. Denies coughing or sick contacts.     Review of Systems See HPI  Past Medical History:  Diagnosis Date  . Allergy   . Depression   . Hyperlipidemia   . Hypertension   . Sleep apnea      Social History   Socioeconomic History  . Marital status: Married    Spouse name: Not on file  . Number of children: Not on file  . Years of education: Not on file  . Highest education level: Not on file  Occupational History  . Not on file  Tobacco Use  . Smoking status: Former Smoker    Packs/day: 1.00    Years: 7.00    Pack years: 7.00    Types: Cigarettes    Quit date: 08/30/1985    Years since quitting: 34.8  . Smokeless tobacco: Never Used  . Tobacco comment: quit 1988  Vaping Use  . Vaping Use: Never used  Substance and Sexual Activity  . Alcohol use: Yes    Comment: on weekends/socially  . Drug use: Never  . Sexual activity: Not on file  Other Topics Concern  . Not on file  Social History Narrative   Works for [employer], in Chief Executive Officer   2 grown Daughters- in [place]   Has 2 grandchildren   Enjoys e-biking.     Walking   Has a dog   Completed some college    Social Determinants of Health   Financial Resource Strain:   . Difficulty of Paying Living Expenses: Not on file  Food Insecurity:   . Worried About Charity fundraiser in the  Last Year: Not on file  . Ran Out of Food in the Last Year: Not on file  Transportation Needs:   . Lack of Transportation (Medical): Not on file  . Lack of Transportation (Non-Medical): Not on file  Physical Activity:   . Days of Exercise per Week: Not on file  . Minutes of Exercise per Session: Not on file  Stress:   . Feeling of Stress : Not on file  Social Connections:   . Frequency of Communication with Friends and Family: Not on file  . Frequency of Social Gatherings with Friends and Family: Not on file  . Attends Religious Services: Not on file  . Active Member of Clubs or Organizations: Not on file  . Attends Archivist Meetings: Not on file  . Marital Status: Not on file  Intimate Partner Violence:   . Fear of Current or Ex-Partner: Not on file  . Emotionally Abused: Not on file  . Physically Abused: Not on file  . Sexually Abused: Not on file    Past Surgical History:  Procedure Laterality Date  .  BREAST BIOPSY  2015  . BREAST EXCISIONAL BIOPSY    . SKIN SURGERY  05/30/2020   Excision of carcinoma Right cheek  . VAGINAL HYSTERECTOMY  2000    Family History  Problem Relation Age of Onset  . CAD Mother 67       died of MI  . CAD Father 25       died at 78 (had cabg)    No Known Allergies  Current Outpatient Medications on File Prior to Visit  Medication Sig Dispense Refill  . Ascorbic Acid (VITAMIN C) 1000 MG tablet Take 1,000 mg by mouth daily.    Marland Kitchen azelastine (ASTELIN) 0.1 % nasal spray     . Cholecalciferol (VITAMIN D3 PO) Take 1,000 Units by mouth.    . eszopiclone (LUNESTA) 2 MG TABS tablet Take 1 tablet (2 mg total) by mouth at bedtime as needed for sleep. Take immediately before bedtime 30 tablet 3  . loratadine (CLARITIN) 10 MG tablet Take 10 mg by mouth daily.    . Multiple Vitamins-Minerals (WOMENS MULTI PO) Take by mouth.    . sertraline (ZOLOFT) 25 MG tablet Take 1 tablet (25 mg total) by mouth daily. 90 tablet 1  . rosuvastatin  (CRESTOR) 10 MG tablet Take 1 tablet (10 mg total) by mouth daily. 90 tablet 3   No current facility-administered medications on file prior to visit.    BP 135/76 (BP Location: Right Arm, Patient Position: Sitting, Cuff Size: Large)   Pulse (!) 56   Temp 98.1 F (36.7 C) (Oral)   Resp 16   Ht 5\' 9"  (1.753 m)   Wt 226 lb (102.5 kg)   SpO2 100%   BMI 33.37 kg/m       Objective:   Physical Exam Constitutional:      Appearance: She is well-developed.  HENT:     Right Ear: Tympanic membrane and ear canal normal.     Left Ear: Tympanic membrane normal.  Cardiovascular:     Rate and Rhythm: Normal rate and regular rhythm.     Heart sounds: Normal heart sounds. No murmur heard.   Pulmonary:     Effort: Pulmonary effort is normal. No respiratory distress.     Breath sounds: Normal breath sounds. No wheezing.  Psychiatric:        Behavior: Behavior normal.        Thought Content: Thought content normal.        Judgment: Judgment normal.           Assessment & Plan:  Insomnia- we discussed good sleep hygiene. Advised OK to continue lunesta as needed.  Anxiety- still having a lot of worrying which is interfering with her sleep. Recommended that she increase zoloft from 25mg  to 50mg  once daily.  Allergic rhinitis- symptoms most consistent with allergies. Recommended the following:  Continue claritin, add flonase 2 sprays each nostril once daily. Call if symptoms worsen or if symptoms fail to improve.   This visit occurred during the SARS-CoV-2 public health emergency.  Safety protocols were in place, including screening questions prior to the visit, additional usage of staff PPE, and extensive cleaning of exam room while observing appropriate contact time as indicated for disinfecting solutions.

## 2020-07-08 NOTE — Patient Instructions (Signed)
Continue claritin, add flonase 2 sprays each nostril once daily. Increase zoloft to 50mg  once daily.

## 2020-08-05 ENCOUNTER — Other Ambulatory Visit: Payer: Self-pay | Admitting: Family

## 2020-09-01 ENCOUNTER — Ambulatory Visit: Payer: BC Managed Care – PPO | Admitting: Dermatology

## 2020-09-07 ENCOUNTER — Other Ambulatory Visit: Payer: Self-pay | Admitting: Family

## 2020-09-17 ENCOUNTER — Ambulatory Visit: Payer: BC Managed Care – PPO | Admitting: Family

## 2020-09-18 ENCOUNTER — Telehealth: Payer: Self-pay | Admitting: Family

## 2020-09-18 ENCOUNTER — Encounter: Payer: Self-pay | Admitting: Family

## 2020-09-18 ENCOUNTER — Other Ambulatory Visit: Payer: Self-pay

## 2020-09-18 ENCOUNTER — Ambulatory Visit (INDEPENDENT_AMBULATORY_CARE_PROVIDER_SITE_OTHER): Payer: BC Managed Care – PPO | Admitting: Family

## 2020-09-18 VITALS — BP 129/62 | HR 63 | Temp 98.2°F | Resp 16 | Ht 69.0 in | Wt 223.0 lb

## 2020-09-18 DIAGNOSIS — G4700 Insomnia, unspecified: Secondary | ICD-10-CM

## 2020-09-18 DIAGNOSIS — F419 Anxiety disorder, unspecified: Secondary | ICD-10-CM | POA: Diagnosis not present

## 2020-09-18 DIAGNOSIS — J309 Allergic rhinitis, unspecified: Secondary | ICD-10-CM

## 2020-09-18 DIAGNOSIS — R109 Unspecified abdominal pain: Secondary | ICD-10-CM

## 2020-09-18 LAB — POC URINALSYSI DIPSTICK (AUTOMATED)
Bilirubin, UA: NEGATIVE
Blood, UA: NEGATIVE
Glucose, UA: NEGATIVE
Ketones, UA: NEGATIVE
Nitrite, UA: NEGATIVE
Protein, UA: POSITIVE — AB
Spec Grav, UA: 1.025 (ref 1.010–1.025)
Urobilinogen, UA: NEGATIVE E.U./dL — AB
pH, UA: 6.5 (ref 5.0–8.0)

## 2020-09-18 MED ORDER — SERTRALINE HCL 50 MG PO TABS: 50.0000 mg | ORAL_TABLET | Freq: Every day | ORAL | 1 refills | Status: DC

## 2020-09-18 NOTE — Telephone Encounter (Signed)
Can you please call [facility] in [place] and request a copy of her colonoscopy?

## 2020-09-18 NOTE — Progress Notes (Signed)
Subjective:    Patient ID: Becky House, female    DOB: 25-Mar-1966, 55 y.o.   MRN: [id number]  HPI  Patient is a 55 yr old female who presents today for follow up.    Hyperlipidemia- maintained on crestor 10mg .  Lab Results  Component Value Date   CHOL 196 04/21/2020   HDL 57 04/21/2020   LDLCALC 113 (H) 04/21/2020   TRIG 146 04/21/2020   CHOLHDL 3.4 04/21/2020   Insomnia- maintained on lunesta which is being prescribed by her sleep specialist. Reports that she is sleeping better on lunesta.    Anxiety- last visit she noted increased worry. We increased her zoloft from 25mg  to 50mg  once daily.  Wt Readings from Last 3 Encounters:  09/18/20 223 lb (101.2 kg)  07/08/20 226 lb (102.5 kg)  05/30/20 227 lb (103 kg)   Allergic rhinitis- last visit we recommended addition of claritin and flonase. She uses flonase prn which she reports seems to help.  Takes claritin daily.   Flank pain- reports right sided flank pain. Pain began 5 days ago.  Started Saturday, worsened on Sunday. Tried cranberry juice, thinks it helped some.  Movement made it worse.  Did not bother her as much at night when she was laying down as it does in chair.   Review of Systems    see HPI  Past Medical History:  Diagnosis Date  . Allergy   . Depression   . Hyperlipidemia   . Hypertension   . Sleep apnea      Social History   Socioeconomic History  . Marital status: Married    Spouse name: Not on file  . Number of children: Not on file  . Years of education: Not on file  . Highest education level: Not on file  Occupational History  . Not on file  Tobacco Use  . Smoking status: Former Smoker    Packs/day: 1.00    Years: 7.00    Pack years: 7.00    Types: Cigarettes    Quit date: 08/30/1985    Years since quitting: 35.0  . Smokeless tobacco: Never Used  . Tobacco comment: quit 1988  Vaping Use  . Vaping Use: Never used  Substance and Sexual Activity  . Alcohol use: Yes    Comment: on  weekends/socially  . Drug use: Never  . Sexual activity: Not on file  Other Topics Concern  . Not on file  Social History Narrative   Works for [employer], in Chief Executive Officer   2 grown Daughters- in [place]   Has 2 grandchildren   Enjoys e-biking.     Walking   Has a dog   Completed some college    Social Determinants of Health   Financial Resource Strain: Not on file  Food Insecurity: Not on file  Transportation Needs: Not on file  Physical Activity: Not on file  Stress: Not on file  Social Connections: Not on file  Intimate Partner Violence: Not on file    Past Surgical History:  Procedure Laterality Date  . BREAST BIOPSY  2015  . BREAST EXCISIONAL BIOPSY    . SKIN SURGERY  05/30/2020   Excision of carcinoma Right cheek  . VAGINAL HYSTERECTOMY  2000    Family History  Problem Relation Age of Onset  . CAD Mother 37       died of MI  . CAD Father 56       died at 47 (had cabg)    No  Known Allergies  Current Outpatient Medications on File Prior to Visit  Medication Sig Dispense Refill  . Ascorbic Acid (VITAMIN C) 1000 MG tablet Take 1,000 mg by mouth daily.    Marland Kitchen azelastine (ASTELIN) 0.1 % nasal spray     . Cholecalciferol (VITAMIN D3 PO) Take 1,000 Units by mouth.    . eszopiclone (LUNESTA) 2 MG TABS tablet Take 1 tablet (2 mg total) by mouth at bedtime as needed for sleep. Take immediately before bedtime 30 tablet 3  . loratadine (CLARITIN) 10 MG tablet Take 10 mg by mouth daily.    . Multiple Vitamins-Minerals (WOMENS MULTI PO) Take by mouth.    . sertraline (ZOLOFT) 50 MG tablet Take 1 tablet (50 mg total) by mouth daily. 90 tablet 0  . rosuvastatin (CRESTOR) 10 MG tablet Take 1 tablet (10 mg total) by mouth daily. 90 tablet 3   No current facility-administered medications on file prior to visit.    BP 129/62 (BP Location: Right Arm, Patient Position: Sitting, Cuff Size: Small)   Pulse 63   Temp 98.2 F (36.8 C) (Oral)   Resp 16   Ht 5\' 9"  (1.753 m)    Wt 223 lb (101.2 kg)   SpO2 100%   BMI 32.93 kg/m    Objective:   Physical Exam Constitutional:      Appearance: She is well-developed and well-nourished.  Cardiovascular:     Rate and Rhythm: Normal rate and regular rhythm.     Heart sounds: Normal heart sounds. No murmur heard.   Pulmonary:     Effort: Pulmonary effort is normal. No respiratory distress.     Breath sounds: Normal breath sounds. No wheezing.  Abdominal:     Tenderness: There is no right CVA tenderness or left CVA tenderness.  Psychiatric:        Mood and Affect: Mood and affect normal.        Behavior: Behavior normal.        Thought Content: Thought content normal.        Judgment: Judgment normal.           Assessment & Plan:  R sided flank pain- likely musculoskeletal.  UA notes + leuks, will send for culture and plan to treat if UTI.  Recommend trial of motrin 400mg  bid for the next few days to see if it helps with her pain.  Insomnia- improved by lunesta. Management per sleep specialist.  Anxiety- improved on zoloft 50mg . She is still having a great deal of work stress however. Continue zoloft 50mg .  Allergic Rhinitis- stable on daily claritin 10mg  and prn flonase.  This visit occurred during the SARS-CoV-2 public health emergency.  Safety protocols were in place, including screening questions prior to the visit, additional usage of staff PPE, and extensive cleaning of exam room while observing appropriate contact time as indicated for disinfecting solutions.

## 2020-09-19 LAB — URINE CULTURE
MICRO NUMBER:: 11438413
Result:: NO GROWTH
SPECIMEN QUALITY:: ADEQUATE

## 2020-09-19 NOTE — Telephone Encounter (Signed)
Patient states her name at that time was Becky House.

## 2020-09-19 NOTE — Telephone Encounter (Signed)
Lvm for patient to call back

## 2020-09-22 NOTE — Telephone Encounter (Signed)
Called patient to try to get gi information from [place], she provided pcp information.  Per [facility] care in [place], no GI information or notes in patient's chart.

## 2020-09-24 NOTE — Telephone Encounter (Signed)
Request faxed to [facility] hospital med rec

## 2020-10-07 ENCOUNTER — Other Ambulatory Visit: Payer: Self-pay | Admitting: Pulmonary Disease

## 2020-10-08 NOTE — Telephone Encounter (Signed)
Dr. O, please advise if you are okay refilling med. 

## 2020-10-27 ENCOUNTER — Ambulatory Visit: Payer: BC Managed Care – PPO | Admitting: Primary Care

## 2020-12-15 ENCOUNTER — Other Ambulatory Visit: Payer: Self-pay | Admitting: Cardiology

## 2020-12-15 NOTE — Telephone Encounter (Signed)
Rosuvastatin 10 mg # 90 tablets no additional refills, patient needs appointment / 1st attempt

## 2020-12-16 DIAGNOSIS — G4733 Obstructive sleep apnea (adult) (pediatric): Secondary | ICD-10-CM | POA: Diagnosis not present

## 2021-02-16 ENCOUNTER — Other Ambulatory Visit: Payer: Self-pay

## 2021-02-16 ENCOUNTER — Other Ambulatory Visit: Payer: Self-pay | Admitting: Cardiology

## 2021-02-16 ENCOUNTER — Other Ambulatory Visit: Payer: Self-pay | Admitting: Family

## 2021-02-16 ENCOUNTER — Other Ambulatory Visit: Payer: Self-pay | Admitting: Pulmonary Disease

## 2021-02-16 NOTE — Telephone Encounter (Signed)
Refill sent to pharmacy.   

## 2021-02-17 NOTE — Telephone Encounter (Signed)
Dr. O, please advise if you are okay refilling med. 

## 2021-02-27 ENCOUNTER — Other Ambulatory Visit (HOSPITAL_BASED_OUTPATIENT_CLINIC_OR_DEPARTMENT_OTHER): Payer: Self-pay | Admitting: Family

## 2021-02-27 DIAGNOSIS — Z1231 Encounter for screening mammogram for malignant neoplasm of breast: Secondary | ICD-10-CM

## 2021-03-17 ENCOUNTER — Ambulatory Visit: Payer: BC Managed Care – PPO | Admitting: Physician Assistant

## 2021-03-18 ENCOUNTER — Ambulatory Visit (INDEPENDENT_AMBULATORY_CARE_PROVIDER_SITE_OTHER): Payer: BC Managed Care – PPO | Admitting: Family

## 2021-03-18 ENCOUNTER — Telehealth: Payer: Self-pay | Admitting: Family

## 2021-03-18 ENCOUNTER — Other Ambulatory Visit: Payer: Self-pay

## 2021-03-18 VITALS — BP 110/74 | HR 65 | Temp 98.3°F | Resp 17 | Ht 69.0 in | Wt 233.0 lb

## 2021-03-18 DIAGNOSIS — R5383 Other fatigue: Secondary | ICD-10-CM

## 2021-03-18 DIAGNOSIS — F32A Depression, unspecified: Secondary | ICD-10-CM

## 2021-03-18 DIAGNOSIS — E785 Hyperlipidemia, unspecified: Secondary | ICD-10-CM | POA: Diagnosis not present

## 2021-03-18 DIAGNOSIS — J309 Allergic rhinitis, unspecified: Secondary | ICD-10-CM

## 2021-03-18 DIAGNOSIS — R030 Elevated blood-pressure reading, without diagnosis of hypertension: Secondary | ICD-10-CM

## 2021-03-18 DIAGNOSIS — G4733 Obstructive sleep apnea (adult) (pediatric): Secondary | ICD-10-CM | POA: Diagnosis not present

## 2021-03-18 LAB — CBC WITH DIFFERENTIAL/PLATELET
Basophils Absolute: 0 10*3/uL (ref 0.0–0.1)
Basophils Relative: 0.6 % (ref 0.0–3.0)
Eosinophils Absolute: 0.1 10*3/uL (ref 0.0–0.7)
Eosinophils Relative: 1.5 % (ref 0.0–5.0)
HCT: 39.6 % (ref 36.0–46.0)
Hemoglobin: 13.2 g/dL (ref 12.0–15.0)
Lymphocytes Relative: 30.3 % (ref 12.0–46.0)
Lymphs Abs: 1.6 10*3/uL (ref 0.7–4.0)
MCHC: 33.3 g/dL (ref 30.0–36.0)
MCV: 90.3 fl (ref 78.0–100.0)
Monocytes Absolute: 0.4 10*3/uL (ref 0.1–1.0)
Monocytes Relative: 8.4 % (ref 3.0–12.0)
Neutro Abs: 3.2 10*3/uL (ref 1.4–7.7)
Neutrophils Relative %: 59.2 % (ref 43.0–77.0)
Platelets: 202 10*3/uL (ref 150.0–400.0)
RBC: 4.39 Mil/uL (ref 3.87–5.11)
RDW: 13.5 % (ref 11.5–15.5)
WBC: 5.4 10*3/uL (ref 4.0–10.5)

## 2021-03-18 LAB — COMPREHENSIVE METABOLIC PANEL
ALT: 38 U/L — ABNORMAL HIGH (ref 0–35)
AST: 24 U/L (ref 0–37)
Albumin: 4.3 g/dL (ref 3.5–5.2)
Alkaline Phosphatase: 82 U/L (ref 39–117)
BUN: 19 mg/dL (ref 6–23)
CO2: 28 mEq/L (ref 19–32)
Calcium: 9.4 mg/dL (ref 8.4–10.5)
Chloride: 105 mEq/L (ref 96–112)
Creatinine, Ser: 0.83 mg/dL (ref 0.40–1.20)
GFR: 78.23 mL/min (ref >60.00)
Glucose, Bld: 92 mg/dL (ref 70–99)
Potassium: 4.2 mEq/L (ref 3.5–5.1)
Sodium: 141 mEq/L (ref 135–145)
Total Bilirubin: 0.5 mg/dL (ref 0.2–1.2)
Total Protein: 6.3 g/dL (ref 6.0–8.3)

## 2021-03-18 LAB — LIPID PANEL
Cholesterol: 186 mg/dL (ref 0–200)
HDL: 62.7 mg/dL (ref >39.00)
LDL Cholesterol: 92 mg/dL (ref 0–99)
NonHDL: 123.66
Total CHOL/HDL Ratio: 3
Triglycerides: 156 mg/dL — ABNORMAL HIGH (ref 0.0–149.0)
VLDL: 31.2 mg/dL (ref 0.0–40.0)

## 2021-03-18 LAB — TSH: TSH: 2.17 u[IU]/mL (ref 0.35–5.50)

## 2021-03-18 MED ORDER — SERTRALINE HCL 50 MG PO TABS: 50.0000 mg | ORAL_TABLET | Freq: Every day | ORAL | 1 refills | Status: DC

## 2021-03-18 NOTE — Assessment & Plan Note (Signed)
Stable on zoloft. Continue 50mg  once daily.

## 2021-03-18 NOTE — Telephone Encounter (Signed)
Medical release for faxed   [phone]

## 2021-03-18 NOTE — Assessment & Plan Note (Signed)
Stable on claritin 10mg  once daily.   Discussed importance of covid booster shot.

## 2021-03-18 NOTE — Assessment & Plan Note (Signed)
Lab Results  Component Value Date   CHOL 196 04/21/2020   HDL 57 04/21/2020   LDLCALC 113 (H) 04/21/2020   TRIG 146 04/21/2020   CHOLHDL 3.4 04/21/2020

## 2021-03-18 NOTE — Progress Notes (Signed)
Subjective:   By signing my name below, I, Shehryar Baig, attest that this documentation has been prepared under the direction and in the presence of Debbrah Alar NP. 03/18/2021      Patient ID: Becky House, female    DOB: 1966/07/26, 55 y.o.   MRN: [id number]  Chief Complaint  Patient presents with   Depression   Hypertension   Hyperlipidemia    HPI Patient is in today for a office visit.  Allergy: She is taking 10 mg Claritin PO daily and reports doing well on it. Depression: She is taking 50 mg Zoloft PO daily and reports doing well on it. She is trying to incorporate some exercise by walking to help with her mood changes. She notes that living further away from her grandchildren affects her mood negatively.  Elevated BP reading: At this time, her blood pressure is doing well.   BP Readings from Last 3 Encounters:  03/18/21 110/74  09/18/20 129/62  07/08/20 135/76    Sleep: She has been taking Lunesta 2 mg PRN before sleep and reports doing well on it. She continues using a CPAP machine to manage her sleep. She reports during her recent vacation having a episode of fatigue that caused her to oversleep. Colonoscopy: She had one done 7 years ago in a [facility] clinic in [place], [place]. She reports results showed a non-bleeding hemorrhoid otherwise everything was normal. Immunizations: She has had 2 Covid-19 vaccines at this time. She is not interested in taking a HIV or Hepatitis C screening at this time.  Health Maintenance Due  Topic Date Due   Pneumococcal Vaccine 62-42 Years old (1 - PCV) Never done   COLONOSCOPY (Pts 45-48yr Insurance coverage will need to be confirmed)  Never done   COVID-19 Vaccine (3 - Pfizer risk series) 02/12/2020    Past Medical History:  Diagnosis Date   Allergy    Depression    Hyperlipidemia    Hypertension    Sleep apnea     Past Surgical History:  Procedure Laterality Date   BREAST BIOPSY  2015   BREAST EXCISIONAL BIOPSY      SKIN SURGERY  05/30/2020   Excision of carcinoma Right cheek   VAGINAL HYSTERECTOMY  2000    Family History  Problem Relation Age of Onset   CAD Mother 436      died of MI   CAD Father 478      died at 569(had cabg)    Social History   Socioeconomic History   Marital status: Married    Spouse name: Not on file   Number of children: Not on file   Years of education: Not on file   Highest education level: Not on file  Occupational History   Not on file  Tobacco Use   Smoking status: Former    Packs/day: 1.00    Years: 7.00    Pack years: 7.00    Types: Cigarettes    Quit date: 08/30/1985    Years since quitting: 35.5   Smokeless tobacco: Never   Tobacco comments:    quit 1988  Vaping Use   Vaping Use: Never used  Substance and Sexual Activity   Alcohol use: Yes    Comment: on weekends/socially   Drug use: Never   Sexual activity: Not on file  Other Topics Concern   Not on file  Social History Narrative   Works for [employer] in [employer]  2 grown Daughters- in  [place]   Has 2 grandchildren   Enjoys e-biking.     Walking   Has a dog   Completed some college    Social Determinants of Health   Financial Resource Strain: Not on file  Food Insecurity: Not on file  Transportation Needs: Not on file  Physical Activity: Not on file  Stress: Not on file  Social Connections: Not on file  Intimate Partner Violence: Not on file    Outpatient Medications Prior to Visit  Medication Sig Dispense Refill   Ascorbic Acid (VITAMIN C) 1000 MG tablet Take 1,000 mg by mouth daily.     azelastine (ASTELIN) 0.1 % nasal spray      Cholecalciferol (VITAMIN D3 PO) Take 1,000 Units by mouth.     eszopiclone (LUNESTA) 2 MG TABS tablet TAKE 1 TABLET (2 MG TOTAL) BY MOUTH AT BEDTIME AS NEEDED FOR SLEEP. TAKE IMMEDIATELY BEFORE BEDTIME 30 tablet 3   loratadine (CLARITIN) 10 MG tablet Take 10 mg by mouth daily.     Multiple Vitamins-Minerals (WOMENS MULTI PO) Take by mouth.      rosuvastatin (CRESTOR) 10 MG tablet TAKE 1 TABLET (10 MG TOTAL) BY MOUTH DAILY. PLEASE CALL FOR APPOINTMENT FOR FURTHER REFILLS 90 tablet 0   sertraline (ZOLOFT) 50 MG tablet TAKE 1 TABLET BY MOUTH EVERY DAY 90 tablet 0   No facility-administered medications prior to visit.    No Known Allergies  Review of Systems  Constitutional:  Positive for malaise/fatigue.      Objective:    Physical Exam Constitutional:      General: She is not in acute distress.    Appearance: Normal appearance. She is not ill-appearing.  HENT:     Head: Normocephalic and atraumatic.     Right Ear: External ear normal.     Left Ear: External ear normal.  Eyes:     Extraocular Movements: Extraocular movements intact.     Pupils: Pupils are equal, round, and reactive to light.  Cardiovascular:     Rate and Rhythm: Normal rate and regular rhythm.     Pulses: Normal pulses.     Heart sounds: Normal heart sounds. No murmur heard.   No gallop.  Pulmonary:     Effort: Pulmonary effort is normal. No respiratory distress.     Breath sounds: Normal breath sounds. No wheezing, rhonchi or rales.  Skin:    General: Skin is warm and dry.  Neurological:     Mental Status: She is alert and oriented to person, place, and time.  Psychiatric:        Behavior: Behavior normal.        Judgment: Judgment normal.    BP 110/74 (BP Location: Left Arm, Patient Position: Sitting, Cuff Size: Normal)   Pulse 65   Temp 98.3 F (36.8 C)   Resp 17   Ht '5\' 9"'  (1.753 m)   Wt 233 lb (105.7 kg)   SpO2 95%   BMI 34.41 kg/m  Wt Readings from Last 3 Encounters:  03/18/21 233 lb (105.7 kg)  09/18/20 223 lb (101.2 kg)  07/08/20 226 lb (102.5 kg)       Assessment & Plan:   Problem List Items Addressed This Visit       Unprioritized   OSA (obstructive sleep apnea)    Good compliance with CPAP, management per Dr. Shary Key.        Hyperlipidemia - Primary    Lab Results  Component Value Date   CHOL 196  04/21/2020   HDL 57 04/21/2020   LDLCALC 113 (H) 04/21/2020   TRIG 146 04/21/2020   CHOLHDL 3.4 04/21/2020        Relevant Orders   Lipid panel   Comp Met (CMET)   Depression    Stable on zoloft. Continue 52m once daily.        Relevant Medications   sertraline (ZOLOFT) 50 MG tablet   Blood pressure elevated without history of HTN    BP Readings from Last 3 Encounters:  03/18/21 110/74  09/18/20 129/62  07/08/20 135/76  BP stable today. Continue to monitor.       Allergic rhinitis    Stable on claritin 125monce daily.   Discussed importance of covid booster shot.        Other Visit Diagnoses     Fatigue, unspecified type       Relevant Orders   CBC with Differential/Platelet   TSH        Meds ordered this encounter  Medications   sertraline (ZOLOFT) 50 MG tablet    Sig: Take 1 tablet (50 mg total) by mouth daily.    Dispense:  90 tablet    Refill:  1    Order Specific Question:   Supervising Provider    Answer:   BLPenni Homans [[id number]]    I, MeDebbrah AlarP. , personally preformed the services described in this documentation.  All medical record entries made by the scribe were at my direction and in my presence.  I have reviewed the chart and discharge instructions (if applicable) and agree that the record reflects my personal performance and is accurate and complete. 03/18/2021  I,Shehryar Baig,acting as a scEducation administratoror MeNance PearNP.,have documented all relevant documentation on the behalf of MeNance PearNP,as directed by  MeNance PearNP while in the presence of MeNance PearNP.    MeNance PearNP

## 2021-03-18 NOTE — Assessment & Plan Note (Signed)
BP Readings from Last 3 Encounters:  03/18/21 110/74  09/18/20 129/62  07/08/20 135/76   BP stable today. Continue to monitor.

## 2021-03-18 NOTE — Assessment & Plan Note (Signed)
Good compliance with CPAP, management per Dr. Shary Key.

## 2021-03-18 NOTE — Telephone Encounter (Signed)
Please call [facility] in [place] and request copy of colonoscopy. Her name at the time was Milus Mallick.

## 2021-04-06 ENCOUNTER — Inpatient Hospital Stay (HOSPITAL_BASED_OUTPATIENT_CLINIC_OR_DEPARTMENT_OTHER): Admission: RE | Admit: 2021-04-06 | Payer: BC Managed Care – PPO | Source: Ambulatory Visit

## 2021-04-27 ENCOUNTER — Ambulatory Visit (HOSPITAL_BASED_OUTPATIENT_CLINIC_OR_DEPARTMENT_OTHER)
Admission: RE | Admit: 2021-04-27 | Discharge: 2021-04-27 | Disposition: A | Discharge status: Home / Self Care | Payer: BC Managed Care – PPO | Source: Ambulatory Visit | Attending: Family | Admitting: Family

## 2021-04-27 ENCOUNTER — Encounter (HOSPITAL_BASED_OUTPATIENT_CLINIC_OR_DEPARTMENT_OTHER): Payer: Self-pay

## 2021-04-27 ENCOUNTER — Other Ambulatory Visit: Payer: Self-pay

## 2021-04-27 DIAGNOSIS — Z1231 Encounter for screening mammogram for malignant neoplasm of breast: Secondary | ICD-10-CM | POA: Diagnosis not present

## 2021-05-20 ENCOUNTER — Other Ambulatory Visit: Payer: Self-pay | Admitting: Cardiology

## 2021-06-10 ENCOUNTER — Encounter: Payer: Self-pay | Admitting: Physician Assistant

## 2021-06-10 ENCOUNTER — Other Ambulatory Visit: Payer: Self-pay

## 2021-06-10 ENCOUNTER — Ambulatory Visit (INDEPENDENT_AMBULATORY_CARE_PROVIDER_SITE_OTHER): Payer: BC Managed Care – PPO | Admitting: Physician Assistant

## 2021-06-10 DIAGNOSIS — Z1283 Encounter for screening for malignant neoplasm of skin: Secondary | ICD-10-CM | POA: Diagnosis not present

## 2021-06-10 DIAGNOSIS — L820 Inflamed seborrheic keratosis: Secondary | ICD-10-CM

## 2021-06-10 DIAGNOSIS — B360 Pityriasis versicolor: Secondary | ICD-10-CM

## 2021-06-10 DIAGNOSIS — L738 Other specified follicular disorders: Secondary | ICD-10-CM

## 2021-06-10 DIAGNOSIS — L570 Actinic keratosis: Secondary | ICD-10-CM | POA: Diagnosis not present

## 2021-06-10 DIAGNOSIS — L719 Rosacea, unspecified: Secondary | ICD-10-CM | POA: Diagnosis not present

## 2021-06-10 DIAGNOSIS — L219 Seborrheic dermatitis, unspecified: Secondary | ICD-10-CM

## 2021-06-10 DIAGNOSIS — L821 Other seborrheic keratosis: Secondary | ICD-10-CM

## 2021-06-10 MED ORDER — KETOCONAZOLE 2 % EX SHAM: MEDICATED_SHAMPOO | CUTANEOUS | 8 refills | Status: DC

## 2021-06-10 MED ORDER — ALCLOMETASONE DIPROPIONATE 0.05 % EX CREA: TOPICAL_CREAM | Freq: Two times a day (BID) | CUTANEOUS | 3 refills | Status: DC | PRN

## 2021-06-10 MED ORDER — FLUOROURACIL 5 % EX CREA: TOPICAL_CREAM | CUTANEOUS | 0 refills | Status: DC

## 2021-06-12 ENCOUNTER — Encounter: Payer: Self-pay | Admitting: Physician Assistant

## 2021-06-20 DIAGNOSIS — R0989 Other specified symptoms and signs involving the circulatory and respiratory systems: Secondary | ICD-10-CM | POA: Diagnosis not present

## 2021-06-20 DIAGNOSIS — Z9989 Dependence on other enabling machines and devices: Secondary | ICD-10-CM

## 2021-06-20 DIAGNOSIS — D6851 Activated protein C resistance: Secondary | ICD-10-CM | POA: Insufficient documentation

## 2021-06-20 DIAGNOSIS — J0141 Acute recurrent pansinusitis: Secondary | ICD-10-CM | POA: Diagnosis not present

## 2021-06-20 DIAGNOSIS — J208 Acute bronchitis due to other specified organisms: Secondary | ICD-10-CM | POA: Diagnosis not present

## 2021-06-20 DIAGNOSIS — B9689 Other specified bacterial agents as the cause of diseases classified elsewhere: Secondary | ICD-10-CM | POA: Diagnosis not present

## 2021-06-20 DIAGNOSIS — D6800 Von Willebrand disease, unspecified: Secondary | ICD-10-CM | POA: Insufficient documentation

## 2021-06-20 HISTORY — DX: Von Willebrand disease, unspecified: D68.00

## 2021-06-20 HISTORY — DX: Activated protein C resistance: D68.51

## 2021-06-20 HISTORY — DX: Dependence on other enabling machines and devices: Z99.89

## 2021-06-22 NOTE — Progress Notes (Signed)
   Follow-Up Visit   Subjective  Becky House is a 55 y.o. female who presents for the following: Annual Exam (Left back raised and feels like it could tear off, right cheek surgery still  pink).   The following portions of the chart were reviewed this encounter and updated as appropriate:  Tobacco  Allergies  Meds  Problems  Med Hx  Surg Hx  Fam Hx      Objective  Well appearing patient in no apparent distress; mood and affect are within normal limits.  A focused examination was performed including head to toe. Relevant physical exam findings are noted in the Assessment and Plan.  Mid Forehead Pink papule with central dell  Left Malar Cheek, Right Malar Cheek Centrifacial erythema with or without papules.   Left Chest Erythematous patches with gritty scale.  Left Malar Cheek, Mid Frontal Scalp, Right Malar Cheek Erythematous plaques with scale.   Mid Back Stuck-on, crusty papules and plaques.    Assessment & Plan  Sebaceous hyperplasia of face Mid Forehead  observe  Rosacea Left Malar Cheek; Right Malar Cheek  alclomethasone (ACLOVATE) 0.05 % cream - Left Malar Cheek, Right Malar Cheek Apply topically 2 (two) times daily as needed (Rash).  AK (actinic keratosis) Left Chest  fluorouracil (EFUDEX) 5 % cream - Left Chest Apply to chest qhs Monday - Sunday x 2 weeks- expect irritation & avoid sunlight  Seborrheic dermatitis Mid Frontal Scalp; Left Malar Cheek; Right Malar Cheek  ketoconazole (NIZORAL) 2 % shampoo - Left Malar Cheek, Mid Frontal Scalp, Right Malar Cheek Apply to affected qd area - let sit 3-5 min before rinsing  Seborrheic keratosis, inflamed Mid Back  Destruction of lesion - Mid Back Complexity: simple   Destruction method: cryotherapy   Informed consent: discussed and consent obtained   Timeout:  patient name, date of birth, surgical site, and procedure verified Lesion destroyed using liquid nitrogen: Yes   Cryotherapy cycles:   3 Outcome: patient tolerated procedure well with no complications      I, Gabrielle Mester, PA-C, have reviewed all documentation's for this visit.  The documentation on 06/22/21 for the exam, diagnosis, procedures and orders are all accurate and complete.

## 2021-06-23 ENCOUNTER — Other Ambulatory Visit: Payer: Self-pay

## 2021-06-24 ENCOUNTER — Encounter: Payer: Self-pay | Admitting: Family

## 2021-06-24 ENCOUNTER — Other Ambulatory Visit: Payer: Self-pay | Admitting: Pulmonary Disease

## 2021-06-24 ENCOUNTER — Ambulatory Visit (INDEPENDENT_AMBULATORY_CARE_PROVIDER_SITE_OTHER): Payer: BC Managed Care – PPO | Admitting: Family

## 2021-06-24 VITALS — BP 127/60 | HR 70 | Temp 98.2°F | Resp 18 | Ht 69.0 in | Wt 233.0 lb

## 2021-06-24 DIAGNOSIS — E669 Obesity, unspecified: Secondary | ICD-10-CM

## 2021-06-24 DIAGNOSIS — Z0000 Encounter for general adult medical examination without abnormal findings: Secondary | ICD-10-CM

## 2021-06-24 HISTORY — DX: Encounter for general adult medical examination without abnormal findings: Z00.00

## 2021-06-24 NOTE — Patient Instructions (Signed)
Continue your work on [employer], exercise.

## 2021-06-24 NOTE — Assessment & Plan Note (Addendum)
Wt Readings from Last 3 Encounters:  06/24/21 233 lb (105.7 kg)  03/18/21 233 lb (105.7 kg)  09/18/20 223 lb (101.2 kg)   Discussed healthy diet, exercise and weight loss.  Will request colo results from [place].  Mammogram up to date. She plans to get covid booster and the flu shot as soon as she is feeling better from her bronchitis.

## 2021-06-24 NOTE — Progress Notes (Signed)
Subjective:   By signing my name below, I, Shehryar Baig, attest that this documentation has been prepared under the direction and in the presence of Debbrah Alar NP. 06/24/2021    Patient ID: Becky House, female    DOB: 02/18/1966, 55 y.o.   MRN: [id number]  Chief Complaint  Patient presents with   Annual Exam    HPI Patient is in today for a comprehensive physical exam.   Bronchitis- She reports having bronchitis since last Sunday. She was given azithromycin to manage her infection and reports doing well while taking it. She is taking OTC tylenol as well and reports doing well while taking it.  Social history- She has no recent changes to her family medial history. She has no recent surgeries in the past year. She occasionally drinks alcohol. She does not use drugs. She does not use tobacco products.  She denies having any unexpected weight change, ear pain, hearing loss and rhinorrhea, visual disturbance, cough, chest pain and leg swelling, nausea, vomiting, diarrhea, constipation, blood in stool, or dysuria and frequency, for myalgias and arthralgias, rash, headaches, adenopathy, depression or anxiety at this time.  Immunizations: She is UTD on tetanus vaccines. She is UTD on shingrix vaccines. She is not interested in getting a flu vaccine due to taking anti-biotics.  Diet: She is not managing a healthy diet. She notes that she travels frequently for work and struggles to make her own food. She is interested in seeing a healthy weight and wellness clinic to manage her weight loss.  Exercise: She does not participate in regular exercise.  Colonoscopy: She reports last completing it at a clinic in [place], [place] on 2015.  Mammogram: Last completed 04/27/2021. Results are normal. Repeat in 1 year.    Health Maintenance Due  Topic Date Due   Pneumococcal Vaccine 52-64 Years old (1 - PCV) Never done   COLONOSCOPY (Pts 45-27yrs Insurance coverage will need to be confirmed)  Never  done   COVID-19 Vaccine (3 - Pfizer risk series) 02/12/2020   INFLUENZA VACCINE  Never done    Past Medical History:  Diagnosis Date   Allergy    Depression    Hyperlipidemia    Hypertension    Sleep apnea    Squamous cell carcinoma of skin 04/2020   right cheek tx with biopsy    Past Surgical History:  Procedure Laterality Date   BREAST BIOPSY Right    SKIN SURGERY  05/30/2020   Excision of carcinoma Right cheek   VAGINAL HYSTERECTOMY  2000    Family History  Problem Relation Age of Onset   CAD Mother 88       died of [place]   CAD Father 61       died at 68 (had cabg)    Social History   Socioeconomic History   Marital status: Married    Spouse name: Not on file   Number of children: Not on file   Years of education: Not on file   Highest education level: Not on file  Occupational History   Not on file  Tobacco Use   Smoking status: Former    Packs/day: 1.00    Years: 7.00    Pack years: 7.00    Types: Cigarettes    Quit date: 08/30/1985    Years since quitting: 35.8   Smokeless tobacco: Never   Tobacco comments:    quit 1988  Vaping Use   Vaping Use: Never used  Substance and Sexual Activity  Alcohol use: Yes    Comment: on weekends/socially   Drug use: Never   Sexual activity: Not on file  Other Topics Concern   Not on file  Social History Narrative   Works for [employer], in Chief Executive Officer   2 grown Daughters- in [place]   Has 2 grandchildren   Enjoys e-biking.     Walking   Has a dog   Completed some college    Social Determinants of Health   Financial Resource Strain: Not on file  Food Insecurity: Not on file  Transportation Needs: Not on file  Physical Activity: Not on file  Stress: Not on file  Social Connections: Not on file  Intimate Partner Violence: Not on file    Outpatient Medications Prior to Visit  Medication Sig Dispense Refill   alclomethasone (ACLOVATE) 0.05 % cream Apply topically 2 (two) times daily as needed (Rash).  180 g 3   Ascorbic Acid (VITAMIN C) 1000 MG tablet Take 1,000 mg by mouth daily.     azelastine (ASTELIN) 0.1 % nasal spray      Cholecalciferol (VITAMIN D3 PO) Take 1,000 Units by mouth.     eszopiclone (LUNESTA) 2 MG TABS tablet TAKE 1 TABLET (2 MG TOTAL) BY MOUTH AT BEDTIME AS NEEDED FOR SLEEP. TAKE IMMEDIATELY BEFORE BEDTIME 30 tablet 3   fluorouracil (EFUDEX) 5 % cream Apply to chest qhs Monday - Sunday x 2 weeks- expect irritation & avoid sunlight 40 g 0   ketoconazole (NIZORAL) 2 % shampoo Apply to affected qd area - let sit 3-5 min before rinsing 120 mL 8   loratadine (CLARITIN) 10 MG tablet Take 10 mg by mouth daily.     Multiple Vitamins-Minerals (WOMENS MULTI PO) Take by mouth.     rosuvastatin (CRESTOR) 10 MG tablet TAKE 1 TABLET (10 MG TOTAL) BY MOUTH DAILY. PLEASE CALL FOR APPOINTMENT FOR FURTHER REFILLS 30 tablet 0   sertraline (ZOLOFT) 50 MG tablet Take 1 tablet (50 mg total) by mouth daily. 90 tablet 1   No facility-administered medications prior to visit.    No Known Allergies  Review of Systems  Constitutional:        (-)unexpected weight change (-)Adenopathy  HENT:  Negative for hearing loss.        (-)Rhinorrhea   Eyes:        (-)Visual disturbance  Respiratory:  Negative for cough.   Cardiovascular:  Negative for chest pain and leg swelling.  Gastrointestinal:  Negative for blood in stool, constipation, diarrhea, nausea and vomiting.  Genitourinary:  Negative for dysuria and frequency.  Musculoskeletal:  Negative for joint pain and myalgias.  Skin:  Negative for rash.  Neurological:  Negative for headaches.  Psychiatric/Behavioral:  Negative for depression. The patient is not nervous/anxious.       Objective:    Physical Exam Constitutional:      General: She is not in acute distress.    Appearance: Normal appearance. She is not ill-appearing.  HENT:     Head: Normocephalic and atraumatic.     Right Ear: Tympanic membrane, ear canal and external ear  normal.     Left Ear: Tympanic membrane, ear canal and external ear normal.  Eyes:     Extraocular Movements: Extraocular movements intact.     Pupils: Pupils are equal, round, and reactive to light.     Comments: No nystagmus  Cardiovascular:     Rate and Rhythm: Normal rate and regular rhythm.     Heart sounds:  Normal heart sounds. No murmur heard.   No gallop.  Pulmonary:     Effort: Pulmonary effort is normal. No respiratory distress.     Breath sounds: Normal breath sounds. No wheezing or rales.  Abdominal:     General: There is no distension.     Palpations: Abdomen is soft.     Tenderness: There is no abdominal tenderness. There is no guarding.  Musculoskeletal:     Comments: 5/5 strength in both upper and lower extremities  Skin:    General: Skin is warm and dry.  Neurological:     Mental Status: She is alert and oriented to person, place, and time.     Deep Tendon Reflexes:     Reflex Scores:      Patellar reflexes are 2+ on the right side and 2+ on the left side. Psychiatric:        Behavior: Behavior normal.        Judgment: Judgment normal.    BP 127/60   Pulse 70   Temp 98.2 F (36.8 C)   Resp 18   Ht 5\' 9"  (1.753 m)   Wt 233 lb (105.7 kg)   SpO2 97%   BMI 34.41 kg/m  Wt Readings from Last 3 Encounters:  06/24/21 233 lb (105.7 kg)  03/18/21 233 lb (105.7 kg)  09/18/20 223 lb (101.2 kg)       Assessment & Plan:   Problem List Items Addressed This Visit       Unprioritized   Preventative health care    Wt Readings from Last 3 Encounters:  06/24/21 233 lb (105.7 kg)  03/18/21 233 lb (105.7 kg)  09/18/20 223 lb (101.2 kg)  Discussed healthy diet, exercise and weight loss.  Will request colo results from [place].  Mammogram up to date. She plans to get covid booster and the flu shot as soon as she is feeling better from her bronchitis.       Other Visit Diagnoses     Obesity, unspecified classification, unspecified obesity type, unspecified whether  serious comorbidity present    -  Primary   Relevant Orders   Amb Ref to Medical Weight Management        No orders of the defined types were placed in this encounter.   I, Debbrah Alar NP, personally preformed the services described in this documentation.  All medical record entries made by the scribe were at my direction and in my presence.  I have reviewed the chart and discharge instructions (if applicable) and agree that the record reflects my personal performance and is accurate and complete. 06/24/2021   I,Shehryar Baig,acting as a Education administrator for Nance Pear, NP.,have documented all relevant documentation on the behalf of Nance Pear, NP,as directed by  Nance Pear, NP while in the presence of Nance Pear, NP.   Nance Pear, NP

## 2021-06-24 NOTE — Telephone Encounter (Signed)
AO please advise on the refill.

## 2021-06-25 NOTE — Telephone Encounter (Signed)
Lunesta refilled

## 2021-07-12 ENCOUNTER — Other Ambulatory Visit: Payer: Self-pay | Admitting: Cardiology

## 2021-07-13 ENCOUNTER — Other Ambulatory Visit: Payer: Self-pay

## 2021-07-13 DIAGNOSIS — T7840XA Allergy, unspecified, initial encounter: Secondary | ICD-10-CM | POA: Insufficient documentation

## 2021-07-13 DIAGNOSIS — I10 Essential (primary) hypertension: Secondary | ICD-10-CM | POA: Insufficient documentation

## 2021-07-14 ENCOUNTER — Other Ambulatory Visit: Payer: Self-pay

## 2021-07-14 ENCOUNTER — Encounter: Payer: Self-pay | Admitting: Cardiology

## 2021-07-14 ENCOUNTER — Ambulatory Visit (INDEPENDENT_AMBULATORY_CARE_PROVIDER_SITE_OTHER): Payer: BC Managed Care – PPO | Admitting: Cardiology

## 2021-07-14 VITALS — BP 144/70 | HR 55 | Ht 68.0 in | Wt 235.0 lb

## 2021-07-14 DIAGNOSIS — G4733 Obstructive sleep apnea (adult) (pediatric): Secondary | ICD-10-CM

## 2021-07-14 DIAGNOSIS — E669 Obesity, unspecified: Secondary | ICD-10-CM | POA: Insufficient documentation

## 2021-07-14 DIAGNOSIS — E782 Mixed hyperlipidemia: Secondary | ICD-10-CM

## 2021-07-14 NOTE — Patient Instructions (Signed)
Medication Instructions:  Your physician recommends that you continue on your current medications as directed. Please refer to the Current Medication list given to you today.  *If you need a refill on your cardiac medications before your next appointment, please call your pharmacy*   Lab Work: None If you have labs (blood work) drawn today and your tests are completely normal, you will receive your results only by: [place] (if you have MyChart) OR A paper copy in the mail If you have any lab test that is abnormal or we need to change your treatment, we will call you to review the results.   Testing/Procedures: None   Follow-Up: At [facility], you and your health needs are our priority.  As part of our continuing mission to provide you with exceptional heart care, we have created designated Provider Care Teams.  These Care Teams include your primary Cardiologist (physician) and Advanced Practice Providers (APPs -  Physician Assistants and Nurse Practitioners) who all work together to provide you with the care you need, when you need it.  We recommend signing up for the patient portal called "MyChart".  Sign up information is provided on this After Visit Summary.  MyChart is used to connect with patients for Virtual Visits (Telemedicine).  Patients are able to view lab/test results, encounter notes, upcoming appointments, etc.  Non-urgent messages can be sent to your provider as well.   To learn more about what you can do with MyChart, go to [email or web address].    Your next appointment:   1 year(s)  The format for your next appointment:   In Person  Provider:   Jyl Heinz, MD    Other Instructions

## 2021-07-14 NOTE — Progress Notes (Signed)
Cardiology Office Note:    Date:  07/14/2021   ID:  Becky House, DOB 1966-08-26, MRN [id number]  PCP:  Debbrah Alar, NP  Cardiologist:  Jenean Lindau, MD   Referring MD: Debbrah Alar, NP    ASSESSMENT:    1. Mixed hyperlipidemia   2. OSA (obstructive sleep apnea)   3. Obesity (BMI 35.0-39.9 without comorbidity)    PLAN:    In order of problems listed above:  Primary prevention stressed with the patient.  Importance of compliance with diet medication stressed and she vocalized understanding.  She was advised to walk at least half an hour a day 5 days a week and she promises to do so.   Mixed dyslipidemia: Diet was emphasized.  Lipids were reviewed.  Lifestyle modification was urged and she is doing well with this and she is trying to get her diet and exercise plan into better shape. Sleep apnea: Sleep health issues were discussed. Obesity: Weight reduction stressed.  Risks of obesity explained and she promises to do better. Patient will be seen in follow-up appointment in 6 months or earlier if the patient has any concerns    Medication Adjustments/Labs and Tests Ordered: Current medicines are reviewed at length with the patient today.  Concerns regarding medicines are outlined above.  No orders of the defined types were placed in this encounter.  No orders of the defined types were placed in this encounter.    No chief complaint on file.    History of Present Illness:    Becky House is a 55 y.o. female.  Patient has past medical history of mixed dyslipidemia and elevated blood pressure without diagnosis of hypertension and obesity.  She denies any problems at this time and takes care of activities of daily living.  No chest pain orthopnea or PND.  She is doing well with overall health.  She is traveling quite a bit so she is not very compliant with diet and exercise.  At the time of my evaluation, the patient is alert awake oriented and in no  distress.  Past Medical History:  Diagnosis Date   Allergic rhinitis 02/11/2020   Allergy    Blood pressure elevated without history of HTN 02/25/2020   Cardiac murmur 02/25/2020   Clotting disorder ([place]) 02/11/2020   CPAP (continuous positive airway pressure) dependence 06/20/2021   Depression    Factor V Leiden carrier ([place]) 06/20/2021   Family history of early CAD 02/11/2020   Hyperlipidemia    Hypertension    Mixed hyperlipidemia 02/11/2020   OSA (obstructive sleep apnea) 02/11/2020   Preventative health care 06/24/2021   Recurrent major depressive disorder ([place]) 02/11/2020   Seasonal allergic rhinitis due to pollen 02/11/2020   Squamous cell carcinoma of skin 04/2020   right cheek tx with biopsy   Von Willebrand disease, unspecified 06/20/2021    Past Surgical History:  Procedure Laterality Date   BREAST BIOPSY Right    SKIN SURGERY  05/30/2020   Excision of carcinoma Right cheek   VAGINAL HYSTERECTOMY  2000    Current Medications: Current Meds  Medication Sig   Ascorbic Acid (VITAMIN C) 1000 MG tablet Take 1,000 mg by mouth daily.   azelastine (ASTELIN) 0.1 % nasal spray Place 1 spray into both nostrils daily.   Cholecalciferol (VITAMIN D3 PO) Take 1,000 Units by mouth daily.   eszopiclone (LUNESTA) 2 MG TABS tablet TAKE 1 TABLET (2 MG TOTAL) BY MOUTH AT BEDTIME AS NEEDED FOR SLEEP. TAKE IMMEDIATELY BEFORE BEDTIME  ketoconazole (NIZORAL) 2 % shampoo Apply 1 application topically daily.   loratadine (CLARITIN) 10 MG tablet Take 10 mg by mouth daily.   Multiple Vitamins-Minerals (WOMENS MULTI PO) Take 1 tablet by mouth daily.   rosuvastatin (CRESTOR) 10 MG tablet TAKE 1 TABLET (10 MG TOTAL) BY MOUTH DAILY. PLEASE CALL FOR APPOINTMENT FOR FURTHER REFILLS   sertraline (ZOLOFT) 50 MG tablet Take 1 tablet (50 mg total) by mouth daily.     Allergies:   Patient has no known allergies.   Social History   Socioeconomic History   Marital status: Married    Spouse name: Not on  file   Number of children: Not on file   Years of education: Not on file   Highest education level: Not on file  Occupational History   Not on file  Tobacco Use   Smoking status: Former    Packs/day: 1.00    Years: 7.00    Pack years: 7.00    Types: Cigarettes    Quit date: 08/30/1985    Years since quitting: 35.8   Smokeless tobacco: Never   Tobacco comments:    quit 1988  Vaping Use   Vaping Use: Never used  Substance and Sexual Activity   Alcohol use: Yes    Comment: on weekends/socially   Drug use: Never   Sexual activity: Not on file  Other Topics Concern   Not on file  Social History Narrative   Works for [employer], in Chief Executive Officer   2 grown Daughters- in [place]   Has 2 grandchildren   Enjoys e-biking.     Walking   Has a dog   Completed some college    Social Determinants of Health   Financial Resource Strain: Not on file  Food Insecurity: Not on file  Transportation Needs: Not on file  Physical Activity: Not on file  Stress: Not on file  Social Connections: Not on file     Family History: The patient's family history includes CAD (age of onset: 64) in her mother; CAD (age of onset: 74) in her father.  ROS:   Please see the history of present illness.    All other systems reviewed and are negative.  EKGs/Labs/Other Studies Reviewed:    The following studies were reviewed today: EKG reveals sinus rhythm and nonspecific ST-T changes   Recent Labs: 03/18/2021: ALT 38; BUN 19; Creatinine, Ser 0.83; Hemoglobin 13.2; Platelets 202.0; Potassium 4.2; Sodium 141; TSH 2.17  Recent Lipid Panel    Component Value Date/Time   CHOL 186 03/18/2021 0723   CHOL 196 04/21/2020 0825   TRIG 156.0 (H) 03/18/2021 0723   HDL 62.70 03/18/2021 0723   HDL 57 04/21/2020 0825   CHOLHDL 3 03/18/2021 0723   VLDL 31.2 03/18/2021 0723   LDLCALC 92 03/18/2021 0723   LDLCALC 113 (H) 04/21/2020 0825    Physical Exam:    VS:  BP (!) 144/70   Pulse (!) 55   Ht 5\' 8"   (1.727 m)   Wt 235 lb (106.6 kg)   SpO2 96%   BMI 35.73 kg/m     Wt Readings from Last 3 Encounters:  07/14/21 235 lb (106.6 kg)  06/24/21 233 lb (105.7 kg)  03/18/21 233 lb (105.7 kg)     GEN: Patient is in no acute distress HEENT: Normal NECK: No JVD; No carotid bruits LYMPHATICS: No lymphadenopathy CARDIAC: Hear sounds regular, 2/6 systolic murmur at the apex. RESPIRATORY:  Clear to auscultation without rales, wheezing or rhonchi  ABDOMEN: Soft, non-tender, non-distended MUSCULOSKELETAL:  No edema; No deformity  SKIN: Warm and dry NEUROLOGIC:  Alert and oriented x 3 PSYCHIATRIC:  Normal affect   Signed, Jenean Lindau, MD  07/14/2021 11:29 AM    [place]

## 2021-07-25 ENCOUNTER — Telehealth: Payer: Self-pay | Admitting: Family

## 2021-07-25 NOTE — Telephone Encounter (Signed)
See mychart.  

## 2021-07-28 DIAGNOSIS — G4733 Obstructive sleep apnea (adult) (pediatric): Secondary | ICD-10-CM | POA: Diagnosis not present

## 2021-10-27 ENCOUNTER — Other Ambulatory Visit: Payer: Self-pay | Admitting: Pulmonary Disease

## 2021-10-30 NOTE — Telephone Encounter (Signed)
Refill for lunesta sent in ?

## 2021-11-14 DIAGNOSIS — L719 Rosacea, unspecified: Secondary | ICD-10-CM | POA: Diagnosis not present

## 2021-11-14 DIAGNOSIS — Z87891 Personal history of nicotine dependence: Secondary | ICD-10-CM | POA: Diagnosis not present

## 2021-11-14 DIAGNOSIS — Z6834 Body mass index (BMI) 34.0-34.9, adult: Secondary | ICD-10-CM | POA: Diagnosis not present

## 2021-12-23 ENCOUNTER — Ambulatory Visit: Payer: BC Managed Care – PPO | Admitting: Family

## 2021-12-30 ENCOUNTER — Other Ambulatory Visit: Payer: Self-pay | Admitting: Family

## 2022-01-09 ENCOUNTER — Other Ambulatory Visit: Payer: Self-pay | Admitting: Cardiology

## 2022-01-11 NOTE — Telephone Encounter (Signed)
Rx refill sent to pharmacy. 

## 2022-02-07 ENCOUNTER — Other Ambulatory Visit: Payer: Self-pay | Admitting: Pulmonary Disease

## 2022-02-09 IMAGING — MG DIGITAL SCREENING BILAT W/ TOMO W/ CAD
8 series · 8 of 24 positions shown · non-contrast
Comparison: Previous exam(s).

CLINICAL DATA: Screening.

EXAM:
DIGITAL SCREENING BILATERAL MAMMOGRAM WITH TOMO AND CAD

[L MLO synth-2D]
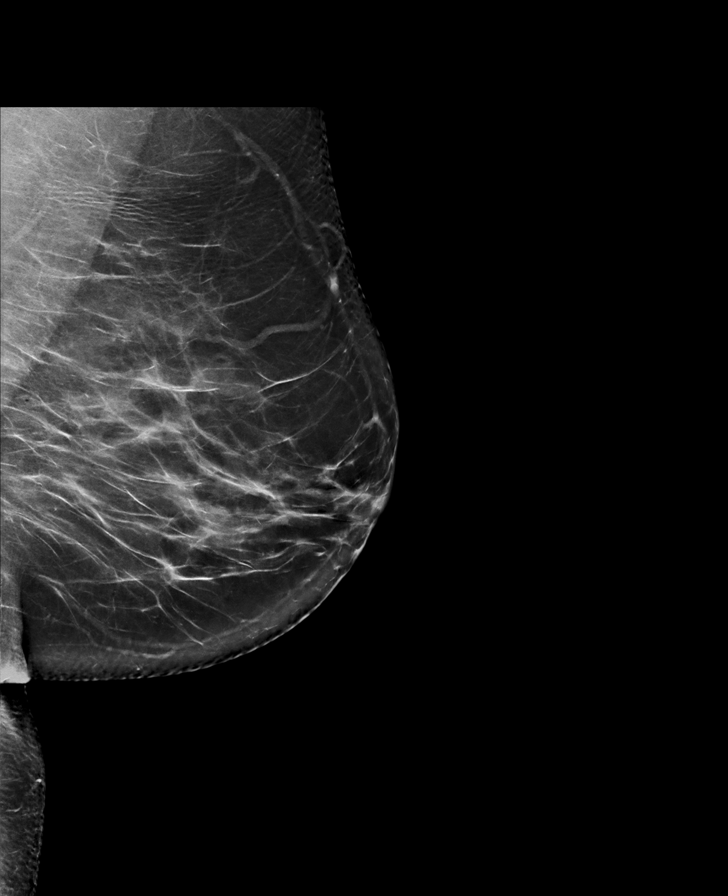

[R CC synth-2D]
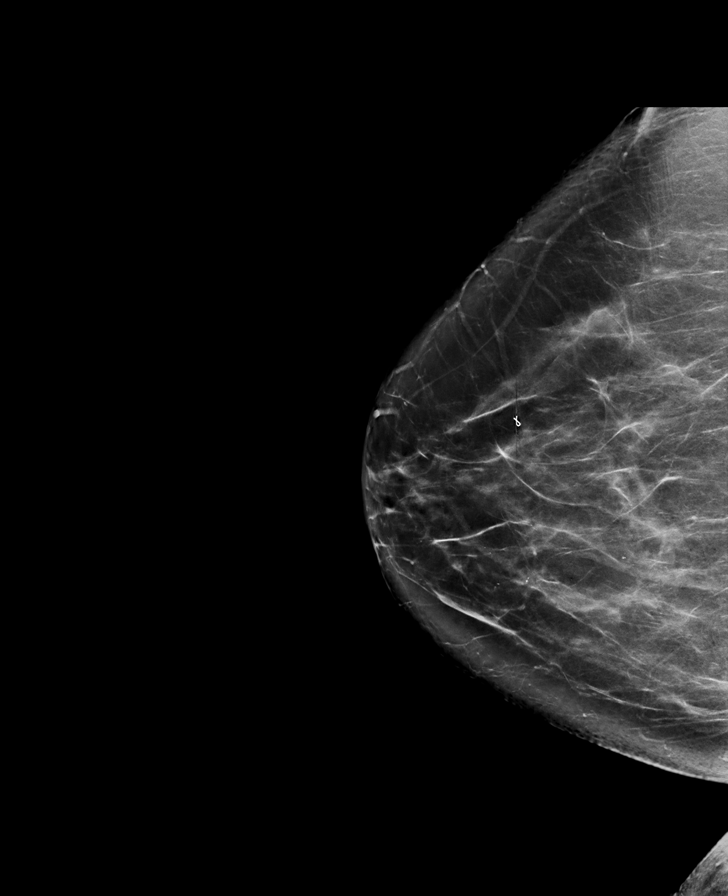

[R MLO synth-2D]
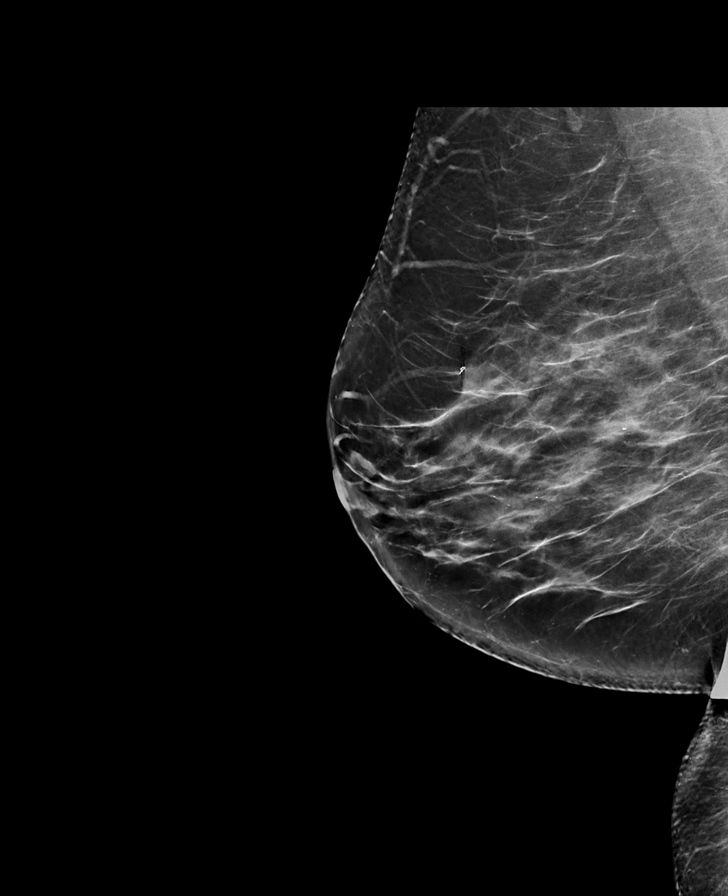

[L CC synth-2D]
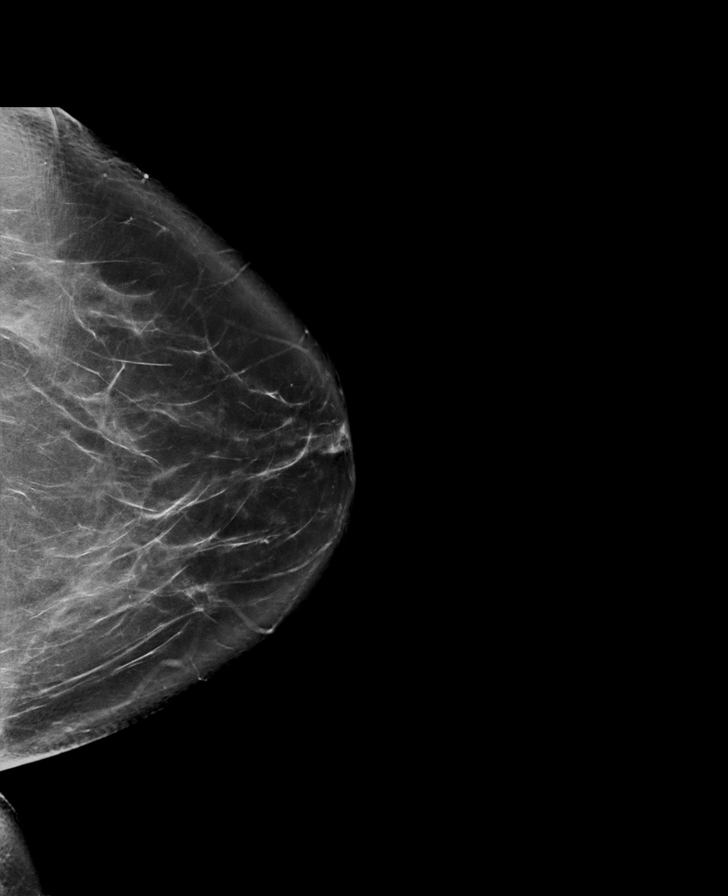

[L CC tomo · tomo slice 49/98.0]
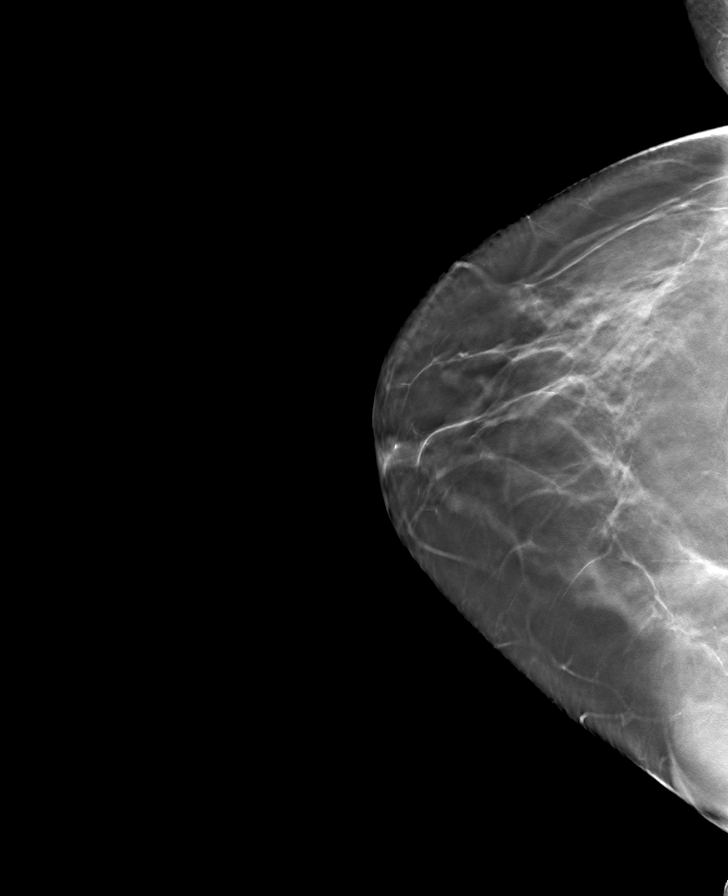

[R MLO tomo · tomo slice 43/85.0]
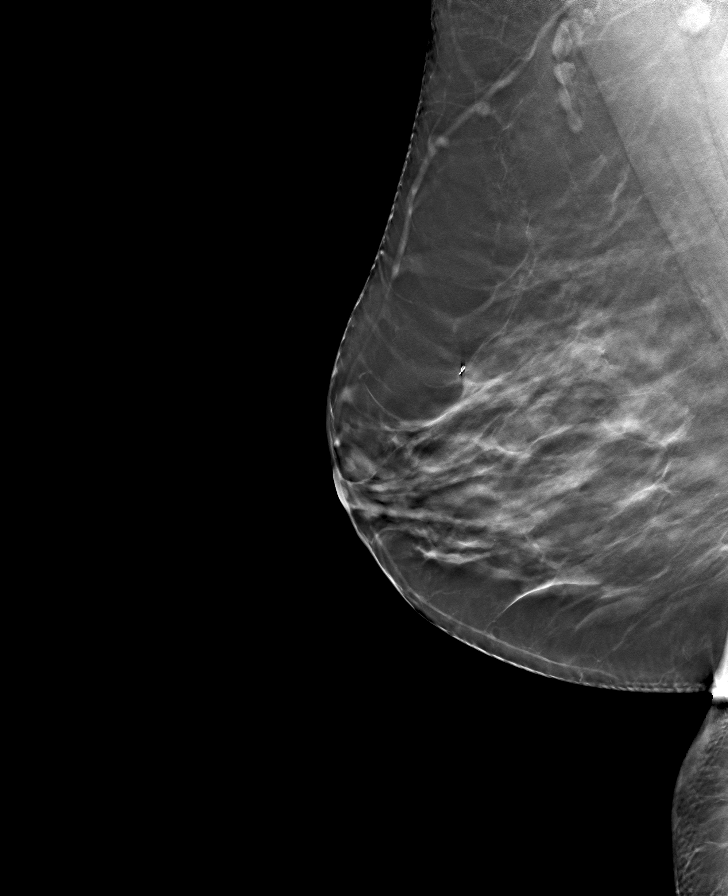

[R CC tomo · tomo slice 47/92.0]
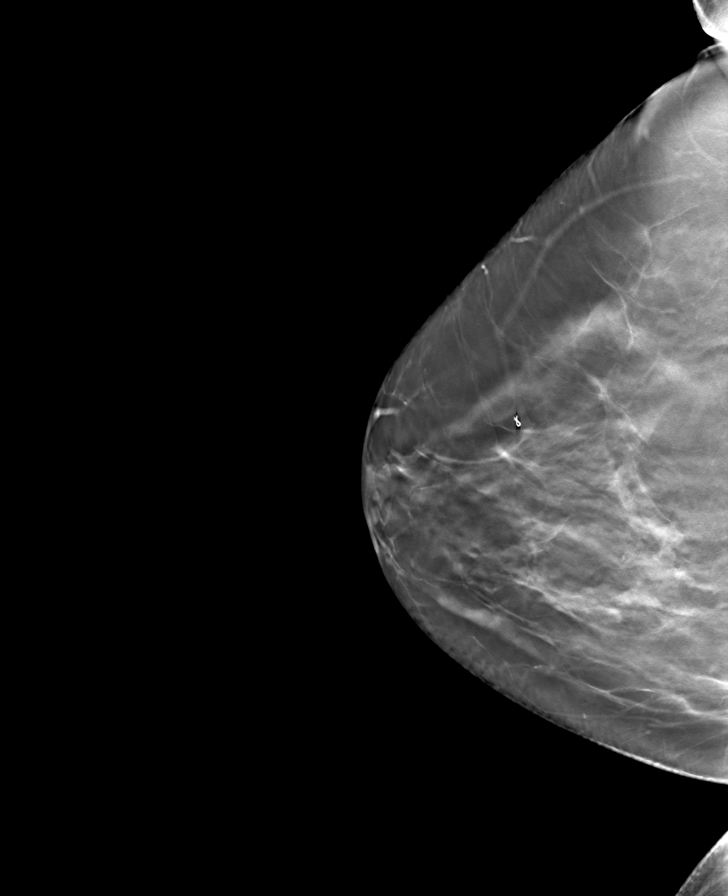

[L MLO tomo · tomo slice 45/90.0]
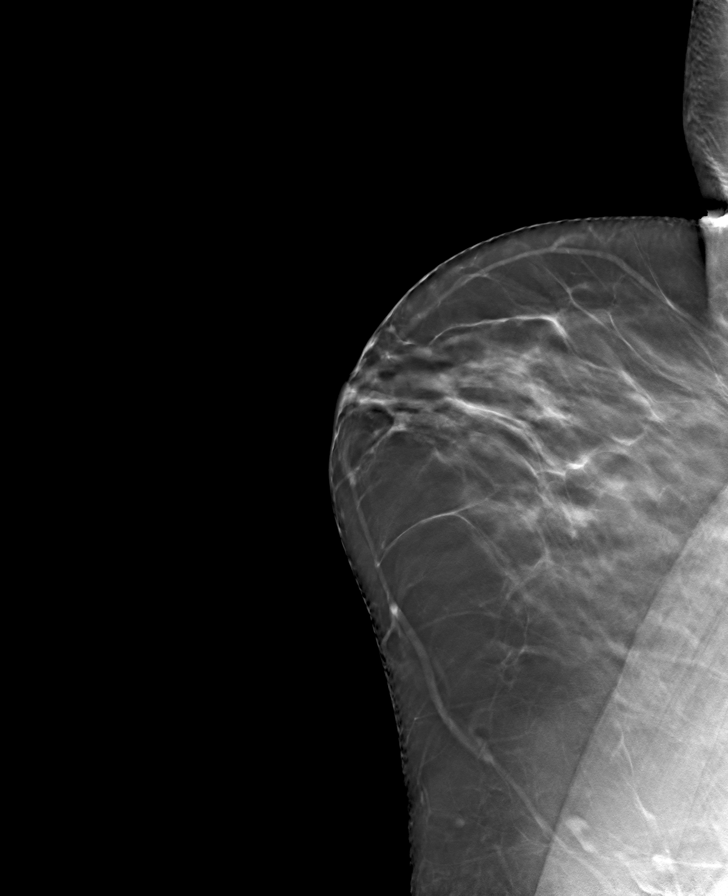

[8 of 24 positions shown; findings below may reference images not displayed]

ACR Breast Density Category c: The breast tissue is heterogeneously
dense, which may obscure small masses.
FINDINGS: There are no findings suspicious for malignancy. Images were
processed with CAD.
IMPRESSION: No mammographic evidence of malignancy. A result letter of this
screening mammogram will be mailed directly to the patient.

RECOMMENDATION:
Screening mammogram in one year. (Code:[id number]-[id number])

BI-RADS CATEGORY  1: Negative.

## 2022-02-13 ENCOUNTER — Other Ambulatory Visit: Payer: Self-pay | Admitting: Pulmonary Disease

## 2022-04-05 DIAGNOSIS — E785 Hyperlipidemia, unspecified: Secondary | ICD-10-CM | POA: Diagnosis not present

## 2022-04-05 DIAGNOSIS — G4730 Sleep apnea, unspecified: Secondary | ICD-10-CM | POA: Diagnosis not present

## 2022-04-05 DIAGNOSIS — F411 Generalized anxiety disorder: Secondary | ICD-10-CM | POA: Diagnosis not present

## 2022-04-05 DIAGNOSIS — F329 Major depressive disorder, single episode, unspecified: Secondary | ICD-10-CM | POA: Diagnosis not present

## 2022-04-08 DIAGNOSIS — E785 Hyperlipidemia, unspecified: Secondary | ICD-10-CM | POA: Diagnosis not present

## 2022-04-08 DIAGNOSIS — Z131 Encounter for screening for diabetes mellitus: Secondary | ICD-10-CM | POA: Diagnosis not present

## 2022-04-08 DIAGNOSIS — R635 Abnormal weight gain: Secondary | ICD-10-CM | POA: Diagnosis not present

## 2022-04-08 DIAGNOSIS — F329 Major depressive disorder, single episode, unspecified: Secondary | ICD-10-CM | POA: Diagnosis not present

## 2022-04-08 DIAGNOSIS — F5101 Primary insomnia: Secondary | ICD-10-CM | POA: Diagnosis not present

## 2022-05-17 DIAGNOSIS — R635 Abnormal weight gain: Secondary | ICD-10-CM | POA: Diagnosis not present

## 2022-05-17 DIAGNOSIS — E785 Hyperlipidemia, unspecified: Secondary | ICD-10-CM | POA: Diagnosis not present

## 2022-05-20 DIAGNOSIS — D6851 Activated protein C resistance: Secondary | ICD-10-CM | POA: Diagnosis not present

## 2022-05-20 DIAGNOSIS — R233 Spontaneous ecchymoses: Secondary | ICD-10-CM | POA: Diagnosis not present

## 2022-06-15 ENCOUNTER — Ambulatory Visit: Payer: BC Managed Care – PPO | Admitting: Physician Assistant

## 2022-06-16 DIAGNOSIS — L821 Other seborrheic keratosis: Secondary | ICD-10-CM | POA: Diagnosis not present

## 2022-06-16 DIAGNOSIS — L814 Other melanin hyperpigmentation: Secondary | ICD-10-CM | POA: Diagnosis not present

## 2022-06-16 DIAGNOSIS — L738 Other specified follicular disorders: Secondary | ICD-10-CM | POA: Diagnosis not present

## 2022-06-16 DIAGNOSIS — D485 Neoplasm of uncertain behavior of skin: Secondary | ICD-10-CM | POA: Diagnosis not present

## 2022-06-16 DIAGNOSIS — D224 Melanocytic nevi of scalp and neck: Secondary | ICD-10-CM | POA: Diagnosis not present

## 2022-06-16 DIAGNOSIS — D225 Melanocytic nevi of trunk: Secondary | ICD-10-CM | POA: Diagnosis not present

## 2022-06-16 DIAGNOSIS — L570 Actinic keratosis: Secondary | ICD-10-CM | POA: Diagnosis not present

## 2022-06-18 DIAGNOSIS — Z1231 Encounter for screening mammogram for malignant neoplasm of breast: Secondary | ICD-10-CM | POA: Diagnosis not present

## 2022-07-16 DIAGNOSIS — G4733 Obstructive sleep apnea (adult) (pediatric): Secondary | ICD-10-CM | POA: Diagnosis not present

## 2022-07-19 DIAGNOSIS — M23302 Other meniscus derangements, unspecified lateral meniscus, unspecified knee: Secondary | ICD-10-CM | POA: Diagnosis not present

## 2022-07-21 ENCOUNTER — Other Ambulatory Visit: Payer: Self-pay | Admitting: Family

## 2022-07-21 ENCOUNTER — Other Ambulatory Visit: Payer: Self-pay | Admitting: Cardiology

## 2022-07-27 DIAGNOSIS — M25562 Pain in left knee: Secondary | ICD-10-CM | POA: Diagnosis not present

## 2022-07-30 DIAGNOSIS — M25562 Pain in left knee: Secondary | ICD-10-CM | POA: Diagnosis not present

## 2022-10-28 ENCOUNTER — Other Ambulatory Visit: Payer: Self-pay | Admitting: Cardiology

## 2022-11-06 ENCOUNTER — Other Ambulatory Visit: Payer: Self-pay | Admitting: Cardiology

## 2022-11-17 ENCOUNTER — Other Ambulatory Visit: Payer: Self-pay | Admitting: Cardiology

## 2022-11-29 ENCOUNTER — Other Ambulatory Visit: Payer: Self-pay | Admitting: Cardiology

## 2022-11-29 NOTE — Telephone Encounter (Signed)
Rx refill sent to pharmacy. 

## 2022-12-07 ENCOUNTER — Other Ambulatory Visit: Payer: Self-pay | Admitting: Cardiology

## 2022-12-07 NOTE — Telephone Encounter (Signed)
Rx denied, patient needs appointment for refill

## 2023-01-09 ENCOUNTER — Other Ambulatory Visit: Payer: Self-pay | Admitting: Family

## 2023-02-08 ENCOUNTER — Other Ambulatory Visit: Payer: Self-pay | Admitting: Family
